# Patient Record
Sex: Male | Born: 1970 | Race: White | Hispanic: No | Marital: Married | Smoking: Former smoker
Health system: Southern US, Community
[De-identification: ages and names within clinical notes are randomized; demographics above are authoritative.]

## PROBLEM LIST (undated history)

## (undated) DIAGNOSIS — S92109A Unspecified fracture of unspecified talus, initial encounter for closed fracture: Secondary | ICD-10-CM

## (undated) DIAGNOSIS — Z720 Tobacco use: Secondary | ICD-10-CM

## (undated) DIAGNOSIS — T7840XA Allergy, unspecified, initial encounter: Secondary | ICD-10-CM

## (undated) DIAGNOSIS — E559 Vitamin D deficiency, unspecified: Secondary | ICD-10-CM

## (undated) HISTORY — PX: INGUINAL HERNIA REPAIR: SUR1180

## (undated) HISTORY — PX: WISDOM TOOTH EXTRACTION: SHX21

---
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
## 2007-10-16 ENCOUNTER — Emergency Department (HOSPITAL_COMMUNITY): Admission: EM | Admit: 2007-10-16 | Discharge: 2007-10-16 | Payer: Self-pay | Admitting: Emergency Medicine

## 2010-12-02 ENCOUNTER — Emergency Department (HOSPITAL_BASED_OUTPATIENT_CLINIC_OR_DEPARTMENT_OTHER)
Admission: EM | Admit: 2010-12-02 | Discharge: 2010-12-02 | Disposition: A | Discharge status: Home / Self Care | Payer: BC Managed Care – PPO | Attending: Emergency Medicine | Admitting: Emergency Medicine

## 2010-12-02 ENCOUNTER — Emergency Department (INDEPENDENT_AMBULATORY_CARE_PROVIDER_SITE_OTHER): Payer: BC Managed Care – PPO

## 2010-12-02 ENCOUNTER — Encounter: Payer: Self-pay | Admitting: *Deleted

## 2010-12-02 DIAGNOSIS — M25539 Pain in unspecified wrist: Secondary | ICD-10-CM

## 2010-12-02 DIAGNOSIS — W19XXXA Unspecified fall, initial encounter: Secondary | ICD-10-CM

## 2010-12-02 DIAGNOSIS — IMO0002 Reserved for concepts with insufficient information to code with codable children: Secondary | ICD-10-CM

## 2010-12-02 DIAGNOSIS — T148XXA Other injury of unspecified body region, initial encounter: Secondary | ICD-10-CM | POA: Insufficient documentation

## 2010-12-02 DIAGNOSIS — M79609 Pain in unspecified limb: Secondary | ICD-10-CM

## 2010-12-02 DIAGNOSIS — S63509A Unspecified sprain of unspecified wrist, initial encounter: Secondary | ICD-10-CM

## 2010-12-02 MED ORDER — HYDROCODONE-ACETAMINOPHEN 5-500 MG PO TABS: 1.0000 | ORAL_TABLET | Freq: Four times a day (QID) | ORAL | Status: AC | PRN

## 2010-12-02 NOTE — ED Provider Notes (Signed)
History     CSN: [id number] Arrival date & time: 12/02/2010  2:34 PM   First MD Initiated Contact with Patient 12/02/10 1437      Chief Complaint  Patient presents with  . Wrist Pain    (Consider location/radiation/quality/duration/timing/severity/associated sxs/prior treatment) Patient is a 40 y.o. male presenting with wrist pain. The history is provided by the patient.  Wrist Pain  fall onto outstretched right wrist yesterday. Constant, dull, non radiating pain to area. Worse w palpation and movement. Skin intact. No numbness/weakness. No elbow or shoulder pain. Right hand dominant. Denies other pain or injury.   No past medical history on file.  No past surgical history on file.  No family history on file.  History  Substance Use Topics  . Smoking status: Not on file  . Smokeless tobacco: Not on file  . Alcohol Use: Not on file      Review of Systems  Constitutional: Negative for fever.  HENT: Negative for neck pain.   Musculoskeletal: Negative for back pain.  Skin: Negative for wound.  Neurological: Negative for weakness and numbness.    Allergies  Review of patient's allergies indicates not on file.  Home Medications  No current outpatient prescriptions on file.  There were no vitals taken for this visit.  Physical Exam  Nursing note and vitals reviewed. Constitutional: He is oriented to person, place, and time. He appears well-developed and well-nourished. No distress.  HENT:  Head: Atraumatic.  Eyes: Pupils are equal, round, and reactive to light.  Neck: Neck supple. No tracheal deviation present.  Cardiovascular: Normal rate.   Pulmonary/Chest: Effort normal. No accessory muscle usage. No respiratory distress.  Musculoskeletal:       c spine non tender. sts and tenderness to right wrist, distal radius. No focal scaphoid tenderness. Radial pulse 2+. Hand nvi. Good rom and elbow without pain. No other focal bony tenderness  Neurological: He is alert  and oriented to person, place, and time.       Motor intact. Steady gait  Skin: Skin is warm and dry.  Psychiatric: He has a normal mood and affect.    ED Course  Procedures (including critical care time)  No results found for this or any previous visit. Dg Wrist Complete Right  12/02/2010  *RADIOLOGY REPORT*  Clinical Data: Injury last night  RIGHT WRIST - COMPLETE 3+ VIEW  Comparison: None.  Findings: Chronic deformity of the fourth metacarpal. On the lateral view, at the carpometacarpal junction, there is a small bony density with a sclerotic border which has a chronic appearance.  No acute fracture or dislocation.  Unremarkable soft tissues.  IMPRESSION: Chronic changes.  No acute bony pathology.  Original Report Authenticated By: Donavan Burnet, M.D.   Dg Hand Complete Right  12/02/2010  *RADIOLOGY REPORT*  Clinical Data: Pain post fall  RIGHT HAND - COMPLETE 3+ VIEW  Comparison: None.  Findings: Three views of the right hand submitted.  No acute fracture or subluxation.  There is old fracture deformity of the fourth metacarpal.  IMPRESSION: No acute fracture or subluxation.  Old fracture deformity of the fourth metacarpal .  Original Report Authenticated By: Natasha Mead, M.D.      No diagnosis found.    MDM  Xrays.    Pt tender over distal radius. Radiologist interpret films noted/neg.  I discussed w pt concern for possible non displaced distal radius fx/occult fx. Will splint, discussed ortho f/u in coming week.     Suzi Roots,  MD 12/02/10 1532

## 2010-12-02 NOTE — ED Notes (Signed)
Playing with kids in yard yesterday afternoon fell and landed on right wrist states he heard and felt a "pop" now pain and swelling from mid right hand to lower forearm area.

## 2018-04-27 ENCOUNTER — Encounter (HOSPITAL_BASED_OUTPATIENT_CLINIC_OR_DEPARTMENT_OTHER): Payer: Self-pay | Admitting: Emergency Medicine

## 2018-04-27 ENCOUNTER — Inpatient Hospital Stay (HOSPITAL_BASED_OUTPATIENT_CLINIC_OR_DEPARTMENT_OTHER)
Admission: EM | Admit: 2018-04-27 | Discharge: 2018-04-29 | DRG: 493 | Disposition: A | Discharge status: Home / Self Care | Payer: BLUE CROSS/BLUE SHIELD | Source: Ambulatory Visit | Attending: Orthopedic Surgery | Admitting: Orthopedic Surgery

## 2018-04-27 ENCOUNTER — Emergency Department (HOSPITAL_BASED_OUTPATIENT_CLINIC_OR_DEPARTMENT_OTHER): Payer: BLUE CROSS/BLUE SHIELD

## 2018-04-27 ENCOUNTER — Inpatient Hospital Stay (HOSPITAL_BASED_OUTPATIENT_CLINIC_OR_DEPARTMENT_OTHER): Payer: BLUE CROSS/BLUE SHIELD

## 2018-04-27 ENCOUNTER — Other Ambulatory Visit: Payer: Self-pay

## 2018-04-27 DIAGNOSIS — S92141A Displaced dome fracture of right talus, initial encounter for closed fracture: Secondary | ICD-10-CM | POA: Diagnosis present

## 2018-04-27 DIAGNOSIS — F17200 Nicotine dependence, unspecified, uncomplicated: Secondary | ICD-10-CM | POA: Diagnosis present

## 2018-04-27 DIAGNOSIS — S92144A Nondisplaced dome fracture of right talus, initial encounter for closed fracture: Secondary | ICD-10-CM

## 2018-04-27 DIAGNOSIS — S93431A Sprain of tibiofibular ligament of right ankle, initial encounter: Secondary | ICD-10-CM | POA: Diagnosis present

## 2018-04-27 DIAGNOSIS — S82871A Displaced pilon fracture of right tibia, initial encounter for closed fracture: Secondary | ICD-10-CM | POA: Diagnosis present

## 2018-04-27 DIAGNOSIS — Y9389 Activity, other specified: Secondary | ICD-10-CM

## 2018-04-27 DIAGNOSIS — Y92018 Other place in single-family (private) house as the place of occurrence of the external cause: Secondary | ICD-10-CM

## 2018-04-27 DIAGNOSIS — S82209A Unspecified fracture of shaft of unspecified tibia, initial encounter for closed fracture: Secondary | ICD-10-CM | POA: Diagnosis present

## 2018-04-27 DIAGNOSIS — W11XXXA Fall on and from ladder, initial encounter: Secondary | ICD-10-CM | POA: Diagnosis present

## 2018-04-27 DIAGNOSIS — S82251A Displaced comminuted fracture of shaft of right tibia, initial encounter for closed fracture: Secondary | ICD-10-CM | POA: Diagnosis present

## 2018-04-27 DIAGNOSIS — Z419 Encounter for procedure for purposes other than remedying health state, unspecified: Secondary | ICD-10-CM

## 2018-04-27 DIAGNOSIS — Z09 Encounter for follow-up examination after completed treatment for conditions other than malignant neoplasm: Secondary | ICD-10-CM

## 2018-04-27 DIAGNOSIS — E8889 Other specified metabolic disorders: Secondary | ICD-10-CM | POA: Diagnosis present

## 2018-04-27 DIAGNOSIS — D62 Acute posthemorrhagic anemia: Secondary | ICD-10-CM | POA: Diagnosis not present

## 2018-04-27 DIAGNOSIS — Z720 Tobacco use: Secondary | ICD-10-CM | POA: Diagnosis present

## 2018-04-27 DIAGNOSIS — Z01818 Encounter for other preprocedural examination: Secondary | ICD-10-CM

## 2018-04-27 DIAGNOSIS — S82831A Other fracture of upper and lower end of right fibula, initial encounter for closed fracture: Secondary | ICD-10-CM | POA: Diagnosis present

## 2018-04-27 DIAGNOSIS — S92109A Unspecified fracture of unspecified talus, initial encounter for closed fracture: Secondary | ICD-10-CM | POA: Diagnosis present

## 2018-04-27 HISTORY — DX: Tobacco use: Z72.0

## 2018-04-27 HISTORY — DX: Unspecified fracture of unspecified talus, initial encounter for closed fracture: S92.109A

## 2018-04-27 LAB — CBC WITH DIFFERENTIAL/PLATELET
Abs Immature Granulocytes: 0.03 10*3/uL (ref 0.00–0.07)
Basophils Absolute: 0.1 10*3/uL (ref 0.0–0.1)
Basophils Relative: 1 %
Eosinophils Absolute: 0.1 10*3/uL (ref 0.0–0.5)
Eosinophils Relative: 2 %
HCT: 41.2 % (ref 39.0–52.0)
Hemoglobin: 13.6 g/dL (ref 13.0–17.0)
Immature Granulocytes: 0 %
Lymphocytes Relative: 34 %
Lymphs Abs: 2.6 10*3/uL (ref 0.7–4.0)
MCH: 32.4 pg (ref 26.0–34.0)
MCHC: 33 g/dL (ref 30.0–36.0)
MCV: 98.1 fL (ref 80.0–100.0)
Monocytes Absolute: 0.7 10*3/uL (ref 0.1–1.0)
Monocytes Relative: 9 %
Neutro Abs: 4.2 10*3/uL (ref 1.7–7.7)
Neutrophils Relative %: 54 %
Platelets: 263 10*3/uL (ref 150–400)
RBC: 4.2 MIL/uL — ABNORMAL LOW (ref 4.22–5.81)
RDW: 13.8 % (ref 11.5–15.5)
WBC: 7.7 10*3/uL (ref 4.0–10.5)
nRBC: 0 % (ref 0.0–0.2)

## 2018-04-27 LAB — BASIC METABOLIC PANEL
Anion gap: 9 (ref 5–15)
BUN: 10 mg/dL (ref 6–20)
CO2: 24 mmol/L (ref 22–32)
Calcium: 8.5 mg/dL — ABNORMAL LOW (ref 8.9–10.3)
Chloride: 100 mmol/L (ref 98–111)
Creatinine, Ser: 0.64 mg/dL (ref 0.61–1.24)
GFR calc Af Amer: >60 mL/min (ref >60)
GFR calc non Af Amer: >60 mL/min (ref >60)
Glucose, Bld: 93 mg/dL (ref 70–99)
Potassium: 4 mmol/L (ref 3.5–5.1)
Sodium: 133 mmol/L — ABNORMAL LOW (ref 135–145)

## 2018-04-27 LAB — PROTIME-INR
INR: 0.9 (ref 0.8–1.2)
Prothrombin Time: 12.1 seconds (ref 11.4–15.2)

## 2018-04-27 MED ORDER — ACETAMINOPHEN 325 MG PO TABS: 325.0000 mg | ORAL_TABLET | Freq: Four times a day (QID) | ORAL | Status: DC | PRN

## 2018-04-27 MED ORDER — HYDROMORPHONE HCL 1 MG/ML IJ SOLN: 0.5000 mg | INTRAMUSCULAR | Status: DC | PRN

## 2018-04-27 MED ORDER — VITAMIN C 500 MG PO TABS
1000.0000 mg | ORAL_TABLET | Freq: Every day | ORAL | Status: DC
  Filled 2018-04-27: qty 2

## 2018-04-27 MED ORDER — METHOCARBAMOL 1000 MG/10ML IJ SOLN
500.0000 mg | Freq: Four times a day (QID) | INTRAVENOUS | Status: DC | PRN
  Filled 2018-04-27: qty 5

## 2018-04-27 MED ORDER — ENOXAPARIN SODIUM 40 MG/0.4ML ~~LOC~~ SOLN: 40.0000 mg | SUBCUTANEOUS | Status: DC

## 2018-04-27 MED ORDER — MORPHINE SULFATE (PF) 2 MG/ML IV SOLN: 0.5000 mg | INTRAVENOUS | Status: DC | PRN

## 2018-04-27 MED ORDER — HYDROCODONE-ACETAMINOPHEN 5-325 MG PO TABS: 1.0000 | ORAL_TABLET | ORAL | Status: DC | PRN

## 2018-04-27 MED ORDER — HYDROMORPHONE HCL 1 MG/ML IJ SOLN
1.0000 mg | Freq: Once | INTRAMUSCULAR | Status: AC
  Administered 2018-04-27: 1 mg via INTRAVENOUS
  Filled 2018-04-27: qty 1

## 2018-04-27 MED ORDER — METHOCARBAMOL 500 MG PO TABS: 500.0000 mg | ORAL_TABLET | Freq: Four times a day (QID) | ORAL | Status: DC | PRN

## 2018-04-27 MED ORDER — MORPHINE SULFATE (PF) 4 MG/ML IV SOLN
4.0000 mg | Freq: Once | INTRAVENOUS | Status: AC
  Administered 2018-04-27: 4 mg via INTRAVENOUS
  Filled 2018-04-27: qty 1

## 2018-04-27 MED ORDER — DOCUSATE SODIUM 100 MG PO CAPS
100.0000 mg | ORAL_CAPSULE | Freq: Two times a day (BID) | ORAL | Status: DC
  Administered 2018-04-28: 100 mg via ORAL
  Filled 2018-04-27: qty 1

## 2018-04-27 MED ORDER — CEFAZOLIN SODIUM-DEXTROSE 2-4 GM/100ML-% IV SOLN
2.0000 g | INTRAVENOUS | Status: AC
  Administered 2018-04-28: 2 g via INTRAVENOUS
  Filled 2018-04-27 (×3): qty 100

## 2018-04-27 MED ORDER — ONDANSETRON HCL 4 MG/2ML IJ SOLN: 4.0000 mg | Freq: Three times a day (TID) | INTRAMUSCULAR | Status: DC | PRN

## 2018-04-27 MED ORDER — HYDROCODONE-ACETAMINOPHEN 7.5-325 MG PO TABS
1.0000 | ORAL_TABLET | ORAL | Status: DC | PRN
  Administered 2018-04-28 (×2): 1 via ORAL
  Filled 2018-04-27: qty 1
  Filled 2018-04-27: qty 2
  Filled 2018-04-27: qty 1

## 2018-04-27 MED ORDER — LACTATED RINGERS IV SOLN
INTRAVENOUS | Status: DC
  Administered 2018-04-28: 100 mL/h via INTRAVENOUS

## 2018-04-27 MED ORDER — POLYETHYLENE GLYCOL 3350 17 G PO PACK
17.0000 g | PACK | Freq: Every day | ORAL | Status: DC
  Filled 2018-04-27: qty 1

## 2018-04-27 NOTE — ED Provider Notes (Signed)
[facility] EMERGENCY DEPARTMENT Provider Note   CSN: [id number] Arrival date & time: 04/27/18  2051    History   Chief Complaint Chief Complaint  Patient presents with   Fall    HPI Hoa Deriso is a 48 y.o. male otherwise a healthy here presenting with fall with right leg pain.  Patient states that he was on top of a 6 foot ladder trying to cut a bush and accidentally fell off the ladder and landed directly on his right leg.  Denies any head injury or other injuries.  In particular, patient denies any left leg pain or back pain.  Patient states that his last meal was around noon today.  States that he is not on any blood thinners. Otherwise healthy, not taking any meds.      The history is provided by the patient.    History reviewed. No pertinent past medical history.  Patient Active Problem List   Diagnosis Date Noted   Tibial fracture 04/27/2018    History reviewed. No pertinent surgical history.      Home Medications    Prior to Admission medications   Not on File    Family History No family history on file.  Social History Social History   Tobacco Use   Smoking status: Current Some Day Smoker   Smokeless tobacco: Never Used  Substance Use Topics   Alcohol use: Yes    Comment: occ   Drug use: No     Allergies   Patient has no known allergies.   Review of Systems Review of Systems  Musculoskeletal:       R leg pain   All other systems reviewed and are negative.    Physical Exam Updated Vital Signs BP (!) 145/102 (BP Location: Right Arm)    Pulse 83    Resp (!) 21    Ht  (1.727 m)    Wt 66.2 kg    SpO2 100%    BMI 22.20 kg/m   Physical Exam Vitals signs and nursing note reviewed.  HENT:     Head: Normocephalic.     Nose: Nose normal.     Mouth/Throat:     Mouth: Mucous membranes are moist.  Eyes:     Extraocular Movements: Extraocular movements intact.     Pupils: Pupils are equal, round, and reactive to  light.  Neck:     Musculoskeletal: Normal range of motion.  Cardiovascular:     Rate and Rhythm: Normal rate.  Pulmonary:     Effort: Pulmonary effort is normal.     Breath sounds: Normal breath sounds.  Abdominal:     General: Abdomen is flat.     Palpations: Abdomen is soft.  Musculoskeletal:     Comments: No lower spinal tenderness. Pelvis stable. Nl ROM bilaterally. No femur tenderness, nl ROM R knee. Obvious swelling distal tib/fib with no obvious skin breakdown. Mild mid foot tenderness. Able to wiggle toes, nl capillary refill and nl DP pulse.   Skin:    General: Skin is warm.     Capillary Refill: Capillary refill takes less than 2 seconds.  Neurological:     General: No focal deficit present.     Mental Status: He is alert and oriented to person, place, and time.  Psychiatric:        Mood and Affect: Mood normal.        Behavior: Behavior normal.      ED Treatments / Results  Labs (all  labs ordered are listed, but only abnormal results are displayed) Labs Reviewed  CBC WITH DIFFERENTIAL/PLATELET - Abnormal; Notable for the following components:      Result Value   RBC 4.20 (*)    All other components within normal limits  BASIC METABOLIC PANEL - Abnormal; Notable for the following components:   Sodium 133 (*)    Calcium 8.5 (*)    All other components within normal limits  PROTIME-INR    EKG None  Radiology Dg Tibia/fibula Right  Result Date: 04/27/2018 CLINICAL DATA:  48 year old male status post fall from ladder. EXAM: RIGHT TIBIA AND FIBULA - 2 VIEW COMPARISON:  Right foot series today. FINDINGS: Alignment about the right knee is preserved and the proximal right tibia and fibula appear intact. Comminuted spiral fracture of the distal right tibia shaft extending through the metadiaphysis to the plafond. Mild over riding, medial displacement, posterior displacement, lateral and posterior angulation. Superimposed mildly comminuted oblique fracture of the medial  malleolus which is mildly distracted. Comminuted and impacted fracture of the distal right fibula metadiaphysis with mild medial displacement and posterior angulation. Comminution appears to extend into the lateral malleolus. The talar dome appears grossly intact, but the talus is fractured better demonstrated on the foot series today. No dislocation of the mortise joint. IMPRESSION: 1. Comminuted spiral fracture of the distal right tibia shaft extending to the plafond. Mild displacement and angulation. Associated oblique fracture of the medial malleolus. 2. Comminuted and impacted fracture of the distal right fibula metadiaphysis. 3. No dislocation of the mortise joint, but talus fracture better demonstrated on the foot series. Electronically Signed   By: Odessa Fleming M.D.   On: 04/27/2018 21:30   Dg Foot Complete Right  Result Date: 04/27/2018 CLINICAL DATA:  48 year old male status post fall from ladder. EXAM: RIGHT FOOT COMPLETE - 3+ VIEW COMPARISON:  Right tib-fib series today reported separately. FINDINGS: Comminuted fractures of the distal tibia and fibula with involvement of the mortise joint are reported separately. On the lateral view there is a fracture through the anterior body of the talus which appears mildly comminuted and minimally displaced. The calcaneus appears to remain intact. The remaining tarsal bones appear intact and normally aligned. No metatarsal or phalanx fracture identified. IMPRESSION: 1. Comminuted and minimally displaced fracture through the anterior body of the talus seen on the lateral view. 2. No other acute fracture or dislocation identified in the right foot. 3. Comminuted, intra-articular distal tibia and fibula fractures reported separately. Electronically Signed   By: Odessa Fleming M.D.   On: 04/27/2018 21:33    Procedures Procedures (including critical care time)  Medications Ordered in ED Medications  HYDROmorphone (DILAUDID) injection 0.5 mg (0 mg Intravenous Hold 04/27/18  2201)  ondansetron (ZOFRAN) injection 4 mg (0 mg Intravenous Hold 04/27/18 2202)  morphine 4 MG/ML injection 4 mg (4 mg Intravenous Given 04/27/18 2142)     Initial Impression / Assessment and Plan / ED Course  I have reviewed the triage vital signs and the nursing notes.  Pertinent labs & imaging results that were available during my care of the patient were reviewed by me and considered in my medical decision making (see chart for details).       Takoda Zhao is a 48 y.o. male here with R leg injury after falling off 6 ft ladder. No head injury or LOC. No signs of chest/abdominal trauma. No spinal tenderness. He has obvious R tib/fib and foot tenderness. Will get xrays to r/o fracture.  10:04 PM Xray showed R tibial shaft and fibula and talus fracture. I talked to Dr. Carola FrostHandy from ortho. He requested CT ankle to further assess the talus. He also wants a splint placed and he will be accepting the patient onto his service. He request keeping patient NPO and he will do surgery tomorrow. [facility]    Final Clinical Impressions(s) / ED Diagnoses   Final diagnoses:  Closed displaced comminuted fracture of shaft of right tibia, initial encounter  Closed nondisplaced fracture of dome of right talus, initial encounter    ED Discharge Orders    None       Charlynne PanderYao, Bodee Lafoe Hsienta, MD 04/27/18 2204

## 2018-04-27 NOTE — ED Notes (Signed)
Called [facility] - advised that patient has a bed ready @ [facility]

## 2018-04-27 NOTE — ED Notes (Signed)
PMS intact before and after. Pt tolerated well. All questions answered. 

## 2018-04-27 NOTE — ED Notes (Signed)
Pt pain 0/10 after morphine.

## 2018-04-27 NOTE — ED Notes (Signed)
Pt last ate approx 12pm. Reports drinking water on the way to ER. Pt informed to remain NPO until further notice.

## 2018-04-27 NOTE — ED Notes (Signed)
ED Provider at bedside. 

## 2018-04-27 NOTE — ED Notes (Signed)
Pt to XR. Declines any pain medication at this time.

## 2018-04-27 NOTE — ED Triage Notes (Signed)
Pt reports falling from ladder ~2030 from about a 65ft height. States his only injury is R leg (+deformity). Strong pedal pulse to R foot. Skin intact.

## 2018-04-28 ENCOUNTER — Inpatient Hospital Stay (HOSPITAL_COMMUNITY): Payer: BLUE CROSS/BLUE SHIELD

## 2018-04-28 ENCOUNTER — Encounter (HOSPITAL_COMMUNITY): Payer: Self-pay | Admitting: Registered Nurse

## 2018-04-28 ENCOUNTER — Inpatient Hospital Stay (HOSPITAL_COMMUNITY): Payer: BLUE CROSS/BLUE SHIELD | Admitting: Anesthesiology

## 2018-04-28 ENCOUNTER — Encounter (HOSPITAL_COMMUNITY)
Admission: EM | Disposition: A | Discharge status: Home / Self Care | Payer: Self-pay | Source: Home / Self Care | Attending: Orthopedic Surgery

## 2018-04-28 HISTORY — PX: EXTERNAL FIXATION LEG: SHX1549

## 2018-04-28 LAB — CBC
HCT: 36.3 % — ABNORMAL LOW (ref 39.0–52.0)
Hemoglobin: 12.6 g/dL — ABNORMAL LOW (ref 13.0–17.0)
MCH: 33.5 pg (ref 26.0–34.0)
MCHC: 34.7 g/dL (ref 30.0–36.0)
MCV: 96.5 fL (ref 80.0–100.0)
Platelets: 269 10*3/uL (ref 150–400)
RBC: 3.76 MIL/uL — ABNORMAL LOW (ref 4.22–5.81)
RDW: 13.8 % (ref 11.5–15.5)
WBC: 8.9 10*3/uL (ref 4.0–10.5)
nRBC: 0 % (ref 0.0–0.2)

## 2018-04-28 LAB — CREATININE, SERUM
Creatinine, Ser: 0.74 mg/dL (ref 0.61–1.24)
GFR calc Af Amer: >60 mL/min (ref >60)
GFR calc non Af Amer: >60 mL/min (ref >60)

## 2018-04-28 LAB — MRSA PCR SCREENING: MRSA by PCR: NEGATIVE

## 2018-04-28 SURGERY — EXTERNAL FIXATION, LOWER EXTREMITY
Anesthesia: General | Site: Leg Lower | Laterality: Right

## 2018-04-28 MED ORDER — PROPOFOL 10 MG/ML IV BOLUS
INTRAVENOUS | Status: AC
  Filled 2018-04-28: qty 20

## 2018-04-28 MED ORDER — ACETAMINOPHEN 325 MG PO TABS: 325.0000 mg | ORAL_TABLET | Freq: Four times a day (QID) | ORAL | Status: DC | PRN

## 2018-04-28 MED ORDER — METHOCARBAMOL 1000 MG/10ML IJ SOLN
500.0000 mg | Freq: Three times a day (TID) | INTRAVENOUS | Status: DC
  Filled 2018-04-28: qty 5

## 2018-04-28 MED ORDER — FENTANYL CITRATE (PF) 100 MCG/2ML IJ SOLN
INTRAMUSCULAR | Status: DC | PRN
  Administered 2018-04-28 (×2): 50 ug via INTRAVENOUS

## 2018-04-28 MED ORDER — DEXAMETHASONE SODIUM PHOSPHATE 10 MG/ML IJ SOLN
INTRAMUSCULAR | Status: AC
  Filled 2018-04-28: qty 1

## 2018-04-28 MED ORDER — SUCCINYLCHOLINE CHLORIDE 200 MG/10ML IV SOSY
PREFILLED_SYRINGE | INTRAVENOUS | Status: AC
  Filled 2018-04-28: qty 10

## 2018-04-28 MED ORDER — POTASSIUM CHLORIDE IN NACL 20-0.9 MEQ/L-% IV SOLN
INTRAVENOUS | Status: DC
  Administered 2018-04-28: 13:00:00 via INTRAVENOUS
  Filled 2018-04-28: qty 1000

## 2018-04-28 MED ORDER — ROPIVACAINE HCL 5 MG/ML IJ SOLN
INTRAMUSCULAR | Status: DC | PRN
  Administered 2018-04-28: 25 mL via PERINEURAL

## 2018-04-28 MED ORDER — POLYETHYLENE GLYCOL 3350 17 G PO PACK
17.0000 g | PACK | Freq: Every day | ORAL | Status: DC
  Administered 2018-04-29: 17 g via ORAL
  Filled 2018-04-28 (×2): qty 1

## 2018-04-28 MED ORDER — HYDROCODONE-ACETAMINOPHEN 5-325 MG PO TABS
1.0000 | ORAL_TABLET | ORAL | Status: DC | PRN
  Administered 2018-04-28: 1 via ORAL
  Administered 2018-04-28 – 2018-04-29 (×2): 2 via ORAL
  Filled 2018-04-28 (×2): qty 2
  Filled 2018-04-28: qty 1

## 2018-04-28 MED ORDER — ONDANSETRON HCL 4 MG PO TABS: 4.0000 mg | ORAL_TABLET | Freq: Four times a day (QID) | ORAL | Status: DC | PRN

## 2018-04-28 MED ORDER — MORPHINE SULFATE (PF) 2 MG/ML IV SOLN: 0.5000 mg | INTRAVENOUS | Status: DC | PRN

## 2018-04-28 MED ORDER — FENTANYL CITRATE (PF) 250 MCG/5ML IJ SOLN
INTRAMUSCULAR | Status: AC
  Filled 2018-04-28: qty 5

## 2018-04-28 MED ORDER — SUGAMMADEX SODIUM 200 MG/2ML IV SOLN
INTRAVENOUS | Status: DC | PRN
  Administered 2018-04-28: 132.4 mg via INTRAVENOUS

## 2018-04-28 MED ORDER — LIDOCAINE 2% (20 MG/ML) 5 ML SYRINGE
INTRAMUSCULAR | Status: DC | PRN
  Administered 2018-04-28: 100 mg via INTRAVENOUS

## 2018-04-28 MED ORDER — LIDOCAINE 2% (20 MG/ML) 5 ML SYRINGE
INTRAMUSCULAR | Status: AC
  Filled 2018-04-28: qty 5

## 2018-04-28 MED ORDER — ENOXAPARIN SODIUM 40 MG/0.4ML ~~LOC~~ SOLN
40.0000 mg | SUBCUTANEOUS | Status: DC
  Administered 2018-04-29: 40 mg via SUBCUTANEOUS
  Filled 2018-04-28: qty 0.4

## 2018-04-28 MED ORDER — ACETAMINOPHEN 500 MG PO TABS
500.0000 mg | ORAL_TABLET | Freq: Two times a day (BID) | ORAL | Status: DC
  Administered 2018-04-28 – 2018-04-29 (×2): 500 mg via ORAL
  Filled 2018-04-28 (×2): qty 1

## 2018-04-28 MED ORDER — METOCLOPRAMIDE HCL 5 MG PO TABS: 5.0000 mg | ORAL_TABLET | Freq: Three times a day (TID) | ORAL | Status: DC | PRN

## 2018-04-28 MED ORDER — ONDANSETRON HCL 4 MG/2ML IJ SOLN
INTRAMUSCULAR | Status: AC
  Filled 2018-04-28: qty 2

## 2018-04-28 MED ORDER — MIDAZOLAM HCL 2 MG/2ML IJ SOLN
INTRAMUSCULAR | Status: AC
  Filled 2018-04-28: qty 2

## 2018-04-28 MED ORDER — ONDANSETRON HCL 4 MG/2ML IJ SOLN: 4.0000 mg | Freq: Four times a day (QID) | INTRAMUSCULAR | Status: DC | PRN

## 2018-04-28 MED ORDER — SUCCINYLCHOLINE CHLORIDE 200 MG/10ML IV SOSY
PREFILLED_SYRINGE | INTRAVENOUS | Status: DC | PRN
  Administered 2018-04-28: 100 mg via INTRAVENOUS

## 2018-04-28 MED ORDER — CEFAZOLIN SODIUM-DEXTROSE 1-4 GM/50ML-% IV SOLN
1.0000 g | Freq: Four times a day (QID) | INTRAVENOUS | Status: AC
  Administered 2018-04-28 – 2018-04-29 (×3): 1 g via INTRAVENOUS
  Filled 2018-04-28 (×3): qty 50

## 2018-04-28 MED ORDER — PROPOFOL 10 MG/ML IV BOLUS
INTRAVENOUS | Status: DC | PRN
  Administered 2018-04-28: 200 mg via INTRAVENOUS
  Administered 2018-04-28: 30 mg via INTRAVENOUS

## 2018-04-28 MED ORDER — ONDANSETRON HCL 4 MG/2ML IJ SOLN
INTRAMUSCULAR | Status: DC | PRN
  Administered 2018-04-28: 4 mg via INTRAVENOUS

## 2018-04-28 MED ORDER — METOCLOPRAMIDE HCL 5 MG/ML IJ SOLN: 5.0000 mg | Freq: Three times a day (TID) | INTRAMUSCULAR | Status: DC | PRN

## 2018-04-28 MED ORDER — ROCURONIUM BROMIDE 10 MG/ML (PF) SYRINGE
PREFILLED_SYRINGE | INTRAVENOUS | Status: DC | PRN
  Administered 2018-04-28: 30 mg via INTRAVENOUS
  Administered 2018-04-28: 20 mg via INTRAVENOUS

## 2018-04-28 MED ORDER — DEXAMETHASONE SODIUM PHOSPHATE 10 MG/ML IJ SOLN
INTRAMUSCULAR | Status: DC | PRN
  Administered 2018-04-28: 10 mg via INTRAVENOUS

## 2018-04-28 MED ORDER — ROCURONIUM BROMIDE 50 MG/5ML IV SOSY
PREFILLED_SYRINGE | INTRAVENOUS | Status: AC
  Filled 2018-04-28: qty 5

## 2018-04-28 MED ORDER — LACTATED RINGERS IV SOLN
INTRAVENOUS | Status: DC | PRN
  Administered 2018-04-28 (×2): via INTRAVENOUS

## 2018-04-28 MED ORDER — DOCUSATE SODIUM 100 MG PO CAPS
100.0000 mg | ORAL_CAPSULE | Freq: Two times a day (BID) | ORAL | Status: DC
  Administered 2018-04-28 – 2018-04-29 (×2): 100 mg via ORAL
  Filled 2018-04-28 (×2): qty 1

## 2018-04-28 MED ORDER — MIDAZOLAM HCL 5 MG/5ML IJ SOLN
INTRAMUSCULAR | Status: DC | PRN
  Administered 2018-04-28: 2 mg via INTRAVENOUS

## 2018-04-28 MED ORDER — HYDROCODONE-ACETAMINOPHEN 7.5-325 MG PO TABS: 1.0000 | ORAL_TABLET | ORAL | Status: DC | PRN

## 2018-04-28 MED ORDER — METHOCARBAMOL 750 MG PO TABS
750.0000 mg | ORAL_TABLET | Freq: Three times a day (TID) | ORAL | Status: DC
  Administered 2018-04-28 – 2018-04-29 (×4): 750 mg via ORAL
  Filled 2018-04-28 (×4): qty 1

## 2018-04-28 MED ORDER — 0.9 % SODIUM CHLORIDE (POUR BTL) OPTIME
TOPICAL | Status: DC | PRN
  Administered 2018-04-28: 09:00:00 1000 mL

## 2018-04-28 MED ORDER — GABAPENTIN 300 MG PO CAPS
300.0000 mg | ORAL_CAPSULE | Freq: Two times a day (BID) | ORAL | Status: DC
  Administered 2018-04-28 – 2018-04-29 (×3): 300 mg via ORAL
  Filled 2018-04-28 (×3): qty 1

## 2018-04-28 MED ORDER — LIDOCAINE-EPINEPHRINE (PF) 1.5 %-1:200000 IJ SOLN
INTRAMUSCULAR | Status: DC | PRN
  Administered 2018-04-28: 15 mL via PERINEURAL

## 2018-04-28 SURGICAL SUPPLY — 65 items
BANDAGE ACE 3X5.8 VEL STRL LF (GAUZE/BANDAGES/DRESSINGS) ×4 IMPLANT
BANDAGE ACE 4X5 VEL STRL LF (GAUZE/BANDAGES/DRESSINGS) ×4 IMPLANT
BANDAGE ACE 6X5 VEL STRL LF (GAUZE/BANDAGES/DRESSINGS) IMPLANT
BANDAGE ESMARK 6X9 LF (GAUZE/BANDAGES/DRESSINGS) ×2 IMPLANT
BAR GLASS FIBER EXFX 11X150 (EXFIX) ×8 IMPLANT
BAR GLASS FIBER EXFX 11X400 (EXFIX) ×8 IMPLANT
BNDG ELASTIC 2X5.8 VLCR STR LF (GAUZE/BANDAGES/DRESSINGS) ×4 IMPLANT
BNDG ESMARK 6X9 LF (GAUZE/BANDAGES/DRESSINGS) ×4
BNDG GAUZE ELAST 4 BULKY (GAUZE/BANDAGES/DRESSINGS) ×8 IMPLANT
BRUSH SCRUB SURG 4.25 DISP (MISCELLANEOUS) ×8 IMPLANT
CLAMP BLUE BAR TO PIN (EXFIX) ×24 IMPLANT
COVER MAYO STAND STRL (DRAPES) ×4 IMPLANT
COVER SURGICAL LIGHT HANDLE (MISCELLANEOUS) ×4 IMPLANT
COVER WAND RF STERILE (DRAPES) ×4 IMPLANT
DRAPE C-ARM 42X72 X-RAY (DRAPES) ×4 IMPLANT
DRAPE C-ARMOR (DRAPES) ×4 IMPLANT
DRAPE HALF SHEET 40X57 (DRAPES) ×3 IMPLANT
DRAPE U-SHAPE 47X51 STRL (DRAPES) ×4 IMPLANT
DRSG EMULSION OIL 3X3 NADH (GAUZE/BANDAGES/DRESSINGS) IMPLANT
DRSG MEPILEX BORDER 4X8 (GAUZE/BANDAGES/DRESSINGS) ×4 IMPLANT
ELECT REM PT RETURN 9FT ADLT (ELECTROSURGICAL) ×4
ELECTRODE REM PT RTRN 9FT ADLT (ELECTROSURGICAL) ×2 IMPLANT
GAUZE SPONGE 4X4 12PLY STRL (GAUZE/BANDAGES/DRESSINGS) ×12 IMPLANT
GLOVE BIO SURGEON STRL SZ7.5 (GLOVE) ×4 IMPLANT
GLOVE BIO SURGEON STRL SZ8 (GLOVE) ×4 IMPLANT
GLOVE BIOGEL PI IND STRL 7.5 (GLOVE) ×2 IMPLANT
GLOVE BIOGEL PI IND STRL 8 (GLOVE) ×2 IMPLANT
GLOVE BIOGEL PI INDICATOR 7.5 (GLOVE) ×2
GLOVE BIOGEL PI INDICATOR 8 (GLOVE) ×2
GOWN STRL REUS W/ TWL LRG LVL3 (GOWN DISPOSABLE) ×4 IMPLANT
GOWN STRL REUS W/ TWL XL LVL3 (GOWN DISPOSABLE) ×2 IMPLANT
GOWN STRL REUS W/TWL LRG LVL3 (GOWN DISPOSABLE) ×4
GOWN STRL REUS W/TWL XL LVL3 (GOWN DISPOSABLE) ×2
HALF PIN 5.0X160 (EXFIX) ×8 IMPLANT
KIT BASIN OR (CUSTOM PROCEDURE TRAY) ×4 IMPLANT
KIT TURNOVER KIT B (KITS) ×4 IMPLANT
MANIFOLD NEPTUNE II (INSTRUMENTS) IMPLANT
NEEDLE HYPO 21X1.5 SAFETY (NEEDLE) IMPLANT
NS IRRIG 1000ML POUR BTL (IV SOLUTION) ×4 IMPLANT
PACK GENERAL/GYN (CUSTOM PROCEDURE TRAY) ×4 IMPLANT
PACK ORTHO EXTREMITY (CUSTOM PROCEDURE TRAY) ×4 IMPLANT
PAD ARMBOARD 7.5X6 YLW CONV (MISCELLANEOUS) ×8 IMPLANT
PAD CAST 4YDX4 CTTN HI CHSV (CAST SUPPLIES) IMPLANT
PADDING CAST COTTON 4X4 STRL (CAST SUPPLIES)
PADDING CAST COTTON 6X4 STRL (CAST SUPPLIES) IMPLANT
PADDING UNDERCAST 2 STRL (CAST SUPPLIES) ×2
PADDING UNDERCAST 2X4 STRL (CAST SUPPLIES) ×2 IMPLANT
PIN 3MM (EXFIX) ×4 IMPLANT
PIN 4X100X20MM (EXFIX) ×4 IMPLANT
PIN CLAMP 2BAR 75MM BLUE (EXFIX) ×4 IMPLANT
PIN TRANSFIXING 5.0 (EXFIX) ×4 IMPLANT
SPONGE LAP 18X18 RF (DISPOSABLE) ×4 IMPLANT
STAPLER VISISTAT 35W (STAPLE) IMPLANT
SUCTION FRAZIER HANDLE 10FR (MISCELLANEOUS) ×2
SUCTION TUBE FRAZIER 10FR DISP (MISCELLANEOUS) ×2 IMPLANT
SUT ETHILON 3 0 PS 1 (SUTURE) IMPLANT
SUT PDS AB 2-0 CT1 27 (SUTURE) IMPLANT
SUT VIC AB 2-0 CT1 27 (SUTURE)
SUT VIC AB 2-0 CT1 TAPERPNT 27 (SUTURE) IMPLANT
TOWEL OR 17X24 6PK STRL BLUE (TOWEL DISPOSABLE) ×4 IMPLANT
TOWEL OR 17X26 10 PK STRL BLUE (TOWEL DISPOSABLE) ×8 IMPLANT
TUBE CONNECTING 12'X1/4 (SUCTIONS) ×1
TUBE CONNECTING 12X1/4 (SUCTIONS) ×3 IMPLANT
UNDERPAD 30X30 (UNDERPADS AND DIAPERS) ×4 IMPLANT
WATER STERILE IRR 1000ML POUR (IV SOLUTION) ×4 IMPLANT

## 2018-04-28 NOTE — Progress Notes (Signed)
[facility] Progress Note Patient Details:  [name] [name] [birth year] [id number]  Patient ID: [name] [name], male   DOB: 1970/11/03, 48 y.o.   MRN: [id number]   [name] [name] 04/28/2018, 6:33 PMThe hardware on patients bed has not been updated. Can not apply trapeze bar.

## 2018-04-28 NOTE — Anesthesia Procedure Notes (Signed)
Procedure Name: Intubation Performed by: Lovie Chol, CRNA Pre-anesthesia Checklist: Patient identified, Emergency Drugs available, Suction available, Patient being monitored and Timeout performed Patient Re-evaluated:Patient Re-evaluated prior to induction Oxygen Delivery Method: Circle system utilized Preoxygenation: Pre-oxygenation with 100% oxygen Induction Type: IV induction, Rapid sequence and Cricoid Pressure applied Laryngoscope Size: Miller and 3 Grade View: Grade I Tube type: Oral Tube size: 7.0 mm Number of attempts: 1 Airway Equipment and Method: Stylet Placement Confirmation: ETT inserted through vocal cords under direct vision,  breath sounds checked- equal and bilateral and CO2 detector Secured at: 21 cm Tube secured with: Tape Dental Injury: Teeth and Oropharynx as per pre-operative assessment

## 2018-04-28 NOTE — H&P (Addendum)
Orthopaedic Trauma Service H&P  Reason for Consult: Right tibial pilon and talar body fracture Referring Physician: Chaney Mallingavid Yao, MD  [name] SalinaKeith Elmquist is an 48 y.o. male.  HPI: Larey SeatFell from ladder over 6 ft onto concrete driveway. No LOC, no other inj, no nerve or vessel inj. Substantial fracture comminution of the joint surface with high energy extension up into the shaft, in addition to associated talus fracture. This very complex injury can often be associated with compartment syndrome that may result in deep infection or limb loss as a result of delay in management. Pain currently controlled at 5/10 deep and throbbing but had increased. No pain with passive stretch of toe flexors or extensors. As I was the on call orthopaedic traumatologist, I was asked to provide immediate consultation and further treatment.  History reviewed. No pertinent past medical history.  History reviewed. No pertinent surgical history.  History reviewed. No pertinent family history.  Social History:  reports that he has been smoking. He has never used smokeless tobacco. He reports current alcohol use. He reports that he does not use drugs.  Allergies: No Known Allergies  Medications:  Prior to Admission:  No medications prior to admission.    Results for orders placed or performed during the hospital encounter of 04/27/18 (from the past 48 hour(s))  CBC with Differential/Platelet     Status: Abnormal   Collection Time: 04/27/18  9:40 PM  Result Value Ref Range   WBC 7.7 4.0 - 10.5 K/uL   RBC 4.20 (L) 4.22 - 5.81 MIL/uL   Hemoglobin 13.6 13.0 - 17.0 g/dL   HCT 16.141.2 09.639.0 - 04.552.0 %   MCV 98.1 80.0 - 100.0 fL   MCH 32.4 26.0 - 34.0 pg   MCHC 33.0 30.0 - 36.0 g/dL   RDW 40.913.8 81.111.5 - 91.415.5 %   Platelets 263 150 - 400 K/uL   nRBC 0.0 0.0 - 0.2 %   Neutrophils Relative % 54 %   Neutro Abs 4.2 1.7 - 7.7 K/uL   Lymphocytes Relative 34 %   Lymphs Abs 2.6 0.7 - 4.0 K/uL   Monocytes Relative 9 %   Monocytes Absolute 0.7  0.1 - 1.0 K/uL   Eosinophils Relative 2 %   Eosinophils Absolute 0.1 0.0 - 0.5 K/uL   Basophils Relative 1 %   Basophils Absolute 0.1 0.0 - 0.1 K/uL   Immature Granulocytes 0 %   Abs Immature Granulocytes 0.03 0.00 - 0.07 K/uL    Comment: Performed at [facility], [address] [place], [place] [address]  Basic metabolic panel     Status: Abnormal   Collection Time: 04/27/18  9:40 PM  Result Value Ref Range   Sodium 133 (L) 135 - 145 mmol/L   Potassium 4.0 3.5 - 5.1 mmol/L   Chloride 100 98 - 111 mmol/L   CO2 24 22 - 32 mmol/L   Glucose, Bld 93 70 - 99 mg/dL   BUN 10 6 - 20 mg/dL   Creatinine, Ser 6.210.64 0.61 - 1.24 mg/dL   Calcium 8.5 (L) 8.9 - 10.3 mg/dL   GFR calc non Af Amer >60 >60 mL/min   GFR calc Af Amer >60 >60 mL/min   Anion gap 9 5 - 15    Comment: Performed at [facility], [address] [place], [place] [address]  Protime-INR     Status: None   Collection Time: 04/27/18  9:40 PM  Result Value Ref Range   Prothrombin Time  12.1 11.4 - 15.2 seconds   INR 0.9 0.8 - 1.2    Comment: (NOTE) INR goal varies based on device and disease states. Performed at [facility], [address] [place], [place] [address]   MRSA PCR Screening     Status: None   Collection Time: 04/28/18  5:28 AM  Result Value Ref Range   MRSA by PCR NEGATIVE NEGATIVE    Comment:        The GeneXpert MRSA Assay (FDA approved for NASAL specimens only), is one component of a comprehensive MRSA colonization surveillance program. It is not intended to diagnose MRSA infection nor to guide or monitor treatment for MRSA infections. Performed at [facility], [address], [place], [place] [address]     Dg Tibia/fibula Right  Result Date: 04/27/2018 CLINICAL DATA:  49 year old male status post fall from ladder. EXAM: RIGHT TIBIA AND FIBULA - 2 VIEW COMPARISON:  Right foot series today. FINDINGS: Alignment about the right knee is preserved and the  proximal right tibia and fibula appear intact. Comminuted spiral fracture of the distal right tibia shaft extending through the metadiaphysis to the plafond. Mild over riding, medial displacement, posterior displacement, lateral and posterior angulation. Superimposed mildly comminuted oblique fracture of the medial malleolus which is mildly distracted. Comminuted and impacted fracture of the distal right fibula metadiaphysis with mild medial displacement and posterior angulation. Comminution appears to extend into the lateral malleolus. The talar dome appears grossly intact, but the talus is fractured better demonstrated on the foot series today. No dislocation of the mortise joint. IMPRESSION: 1. Comminuted spiral fracture of the distal right tibia shaft extending to the plafond. Mild displacement and angulation. Associated oblique fracture of the medial malleolus. 2. Comminuted and impacted fracture of the distal right fibula metadiaphysis. 3. No dislocation of the mortise joint, but talus fracture better demonstrated on the foot series. Electronically Signed   By: Odessa Fleming M.D.   On: 04/27/2018 21:30   Ct Ankle Right Wo Contrast  Result Date: 04/27/2018 CLINICAL DATA:  No distal tibial and fibular fractures and findings suggestive of talar fracture on recent plain film EXAM: CT OF THE RIGHT ANKLE WITHOUT CONTRAST TECHNIQUE: Multidetector CT imaging of the right ankle was performed according to the standard protocol. Multiplanar CT image reconstructions were also generated. COMPARISON:  Plain films from earlier in the same day. FINDINGS: Bones/Joint/Cartilage Oblique fracture through the distal diaphysis of the tibia with extension to the articular surface and significant comminution distally along the lateral aspect of the tibia. Comminuted medial malleolar fracture is noted as well. No posterior malleolar fracture is identified. Significantly comminuted distal fibular fracture is noted similar to that seen on  prior plain film examination. Small bony fragments are noted within the tibiotalar articulation. There is a vertically oriented fracture through the talus posteriorly involving the posterolateral aspect of the talar dome. The changes seen on plain film anteriorly within the talus are projectional in nature and do not reflect actual talar fracture. The remainder of the tarsal bones are within normal limits. No calcaneal fracture is seen. Ligaments Suboptimally assessed by CT. Muscles and Tendons Edematous changes are noted within the surrounding musculature. No other muscular abnormality is seen. No hematoma is noted. Soft tissues Edematous changes as described above in the subcutaneous tissues related to the recent fracture. No other focal abnormality is noted. IMPRESSION: Severely comminuted fractures of the distal tibia and distal fibula with extension tibiotalar articulation with multiple small bony  fragments in the tibiotalar joint. Fracture through the lateral aspect of the talar dome is noted posteriorly with mild displacement. No other talar abnormality is noted. Electronically Signed   By: Alcide Clever M.D.   On: 04/27/2018 22:28   Dg Chest Port 1 View  Result Date: 04/27/2018 CLINICAL DATA:  48 y/o  M; fractures after fall.  Preop. EXAM: PORTABLE CHEST 1 VIEW COMPARISON:  None. FINDINGS: The heart size and mediastinal contours are within normal limits. Both lungs are clear. The visualized skeletal structures are unremarkable. IMPRESSION: No active disease. Electronically Signed   By: Mitzi Hansen M.D.   On: 04/27/2018 22:52   Dg Foot Complete Right  Result Date: 04/27/2018 CLINICAL DATA:  48 year old male status post fall from ladder. EXAM: RIGHT FOOT COMPLETE - 3+ VIEW COMPARISON:  Right tib-fib series today reported separately. FINDINGS: Comminuted fractures of the distal tibia and fibula with involvement of the mortise joint are reported separately. On the lateral view there is a  fracture through the anterior body of the talus which appears mildly comminuted and minimally displaced. The calcaneus appears to remain intact. The remaining tarsal bones appear intact and normally aligned. No metatarsal or phalanx fracture identified. IMPRESSION: 1. Comminuted and minimally displaced fracture through the anterior body of the talus seen on the lateral view. 2. No other acute fracture or dislocation identified in the right foot. 3. Comminuted, intra-articular distal tibia and fibula fractures reported separately. Electronically Signed   By: Odessa Fleming M.D.   On: 04/27/2018 21:33    ROS No recent fever, bleeding abnormalities, urologic dysfunction, GI problems, or weight gain.  Blood pressure 133/80, pulse 78, temperature 98.2 F (36.8 C), temperature source Oral, resp. rate 16, height 5\' 8"  (1.727 m), weight 66.2 kg, SpO2 97 %. Physical Exam NCAT RRR CTA S/NT/ND RLE No traumatic wounds, ecchymosis, or rash  Tender, swollen  No pain with passive stretch  No knee or ankle effusion  Knee stable to varus/ valgus and anterior/posterior stress  Sens DPN, SPN, TN intact  Motor EHL, ext, flex, evers intact grossly  No significant edema at toes, brisk cap refill   Assessment/Plan: Right tibia pilon, tibia and fibula and posterolateral talus fracture for immediate closed reduction, possible limited internal fixation, possible compartment release/ fasciotomies.  I discussed with the patient the risks and benefits of surgery, including the possibility of infection, nerve injury, vessel injury, wound breakdown, arthritis, symptomatic hardware, DVT/ PE, loss of motion, malunion, nonunion, and need for further surgery among others.  We also specifically discussed the need to stage surgery because of the elevated risk of soft tissue breakdown that could lead to amputation.  He acknowledged these risks and wished to proceed.  Myrene Galas, MD [facility],  [facility] [phone] 04/28/2018  8:12 AM

## 2018-04-28 NOTE — Transfer of Care (Signed)
Immediate Anesthesia Transfer of Care Note  Patient: Izaya Spearin  Procedure(s) Performed: External Fixation Leg of right ankle (Right Leg Lower)  Patient Location: PACU  Anesthesia Type:General  Level of Consciousness: oriented, drowsy and patient cooperative  Airway & Oxygen Therapy: Patient Spontanous Breathing and Patient connected to face mask oxygen  Post-op Assessment: Report given to RN and Post -op Vital signs reviewed and stable  Post vital signs: Reviewed  Last Vitals:  Vitals Value Taken Time  BP    Temp    Pulse 73 04/28/2018 10:09 AM  Resp 18 04/28/2018 10:09 AM  SpO2 100 % 04/28/2018 10:09 AM  Vitals shown include unvalidated device data.  Last Pain:  Vitals:   04/28/18 0623  TempSrc:   PainSc: 0-No pain         Complications: No apparent anesthesia complications

## 2018-04-28 NOTE — Anesthesia Procedure Notes (Signed)
Anesthesia Regional Block: Popliteal block   Pre-Anesthetic Checklist: ,, timeout performed, Correct Patient, Correct Site, Correct Laterality, Correct Procedure, Correct Position, site marked, Risks and benefits discussed,  Surgical consent,  Pre-op evaluation,  At surgeon's request and post-op pain management  Laterality: Right  Prep: chloraprep       Needles:  Injection technique: Single-shot  Needle Type: Echogenic Needle     Needle Length: 9cm      Additional Needles:   Procedures:,,,, ultrasound used (permanent image in chart),,,,  Narrative:  Start time: 04/28/2018 7:49 AM End time: 04/28/2018 7:59 AM Injection made incrementally with aspirations every 5 mL.  Performed by: Personally  Anesthesiologist: Eilene Ghazi, MD  Additional Notes: Patient tolerated the procedure well without complications

## 2018-04-28 NOTE — Plan of Care (Signed)
  Problem: Education: Goal: Knowledge of the prescribed therapeutic regimen will improve Outcome: Progressing   Problem: Activity: Goal: Ability to increase mobility will improve Outcome: Progressing   Problem: Physical Regulation: Goal: Postoperative complications will be avoided or minimized Outcome: Progressing   Problem: Pain Management: Goal: Pain level will decrease with appropriate interventions Outcome: Progressing   Problem: Skin Integrity: Goal: Will show signs of wound healing Outcome: Progressing   

## 2018-04-28 NOTE — Anesthesia Preprocedure Evaluation (Addendum)
Anesthesia Evaluation  Patient identified by MRN, date of birth, ID band Patient awake    Reviewed: Allergy & Precautions, NPO status , Patient's Chart, lab work & pertinent test results  Airway Mallampati: II  TM Distance: >3 FB Neck ROM: Full    Dental no notable dental hx. (+) Teeth Intact, Dental Advisory Given   Pulmonary Current Smoker,    Pulmonary exam normal breath sounds clear to auscultation       Cardiovascular negative cardio ROS Normal cardiovascular exam Rhythm:Regular Rate:Normal     Neuro/Psych negative neurological ROS  negative psych ROS   GI/Hepatic negative GI ROS, Neg liver ROS,   Endo/Other  negative endocrine ROS  Renal/GU negative Renal ROS  negative genitourinary   Musculoskeletal negative musculoskeletal ROS (+)   Abdominal   Peds negative pediatric ROS (+)  Hematology negative hematology ROS (+)   Anesthesia Other Findings   Reproductive/Obstetrics negative OB ROS                            Anesthesia Physical Anesthesia Plan  ASA: II  Anesthesia Plan: General   Post-op Pain Management:  Regional for Post-op pain   Induction: Intravenous and Rapid sequence  PONV Risk Score and Plan: Ondansetron, Treatment may vary due to age or medical condition and Dexamethasone  Airway Management Planned: Oral ETT  Additional Equipment:   Intra-op Plan:   Post-operative Plan: Extubation in OR  Informed Consent: I have reviewed the patients History and Physical, chart, labs and discussed the procedure including the risks, benefits and alternatives for the proposed anesthesia with the patient or authorized representative who has indicated his/her understanding and acceptance.     Dental advisory given  Plan Discussed with: CRNA and Surgeon  Anesthesia Plan Comments:         Anesthesia Quick Evaluation

## 2018-04-28 NOTE — Anesthesia Postprocedure Evaluation (Signed)
Anesthesia Post Note  Patient: [name] [name]  Procedure(s) Performed: External Fixation Leg of right ankle (Right Leg Lower)     Patient location during evaluation: PACU Anesthesia Type: General Level of consciousness: awake and alert Pain management: pain level controlled Vital Signs Assessment: post-procedure vital signs reviewed and stable Respiratory status: spontaneous breathing, nonlabored ventilation, respiratory function stable and patient connected to nasal cannula oxygen Cardiovascular status: blood pressure returned to baseline and stable Postop Assessment: no apparent nausea or vomiting Anesthetic complications: no    Last Vitals:  Vitals:   04/28/18 1009 04/28/18 1010  BP:  127/86  Pulse:  74  Resp:  16  Temp: 36.7 C   SpO2:  100%    Last Pain:  Vitals:   04/28/18 1009  TempSrc:   PainSc: 0-No pain                 Megann Easterwood S

## 2018-04-28 NOTE — Progress Notes (Signed)
RN called report to [place], RN in [place].

## 2018-04-29 ENCOUNTER — Encounter (HOSPITAL_COMMUNITY): Payer: Self-pay | Admitting: Orthopedic Surgery

## 2018-04-29 DIAGNOSIS — Z720 Tobacco use: Secondary | ICD-10-CM | POA: Diagnosis present

## 2018-04-29 HISTORY — DX: Tobacco use: Z72.0

## 2018-04-29 LAB — COMPREHENSIVE METABOLIC PANEL
ALT: 15 U/L (ref 0–44)
AST: 21 U/L (ref 15–41)
Albumin: 3.6 g/dL (ref 3.5–5.0)
Alkaline Phosphatase: 44 U/L (ref 38–126)
Anion gap: 11 (ref 5–15)
BUN: 9 mg/dL (ref 6–20)
CO2: 23 mmol/L (ref 22–32)
Calcium: 8.7 mg/dL — ABNORMAL LOW (ref 8.9–10.3)
Chloride: 103 mmol/L (ref 98–111)
Creatinine, Ser: 0.74 mg/dL (ref 0.61–1.24)
GFR calc Af Amer: >60 mL/min (ref >60)
GFR calc non Af Amer: >60 mL/min (ref >60)
Glucose, Bld: 97 mg/dL (ref 70–99)
Potassium: 3.6 mmol/L (ref 3.5–5.1)
Sodium: 137 mmol/L (ref 135–145)
Total Bilirubin: 1 mg/dL (ref 0.3–1.2)
Total Protein: 6.6 g/dL (ref 6.5–8.1)

## 2018-04-29 LAB — CBC
HCT: 36.4 % — ABNORMAL LOW (ref 39.0–52.0)
Hemoglobin: 12.3 g/dL — ABNORMAL LOW (ref 13.0–17.0)
MCH: 32.9 pg (ref 26.0–34.0)
MCHC: 33.8 g/dL (ref 30.0–36.0)
MCV: 97.3 fL (ref 80.0–100.0)
Platelets: 257 10*3/uL (ref 150–400)
RBC: 3.74 MIL/uL — ABNORMAL LOW (ref 4.22–5.81)
RDW: 13.6 % (ref 11.5–15.5)
WBC: 9.5 10*3/uL (ref 4.0–10.5)
nRBC: 0 % (ref 0.0–0.2)

## 2018-04-29 MED ORDER — VITAMIN D 25 MCG (1000 UNIT) PO TABS
2000.0000 [IU] | ORAL_TABLET | Freq: Two times a day (BID) | ORAL | Status: DC
  Administered 2018-04-29: 2000 [IU] via ORAL
  Filled 2018-04-29: qty 2

## 2018-04-29 MED ORDER — METHOCARBAMOL 750 MG PO TABS: 750.0000 mg | ORAL_TABLET | Freq: Three times a day (TID) | ORAL | 0 refills | Status: DC | PRN

## 2018-04-29 MED ORDER — VITAMIN D 125 MCG (5000 UT) PO CAPS: 1.0000 | ORAL_CAPSULE | Freq: Every day | ORAL | 2 refills | Status: AC

## 2018-04-29 MED ORDER — HYDROCODONE-ACETAMINOPHEN 7.5-325 MG PO TABS: 1.0000 | ORAL_TABLET | Freq: Four times a day (QID) | ORAL | 0 refills | Status: DC | PRN

## 2018-04-29 MED ORDER — ENOXAPARIN (LOVENOX) PATIENT EDUCATION KIT: 1.0000 | PACK | Freq: Once | 0 refills | Status: AC

## 2018-04-29 MED ORDER — GABAPENTIN 300 MG PO CAPS: 300.0000 mg | ORAL_CAPSULE | Freq: Two times a day (BID) | ORAL | 0 refills | Status: AC

## 2018-04-29 MED ORDER — ENOXAPARIN (LOVENOX) PATIENT EDUCATION KIT: PACK | Freq: Once | Status: DC

## 2018-04-29 MED ORDER — ACETAMINOPHEN 500 MG PO TABS: 500.0000 mg | ORAL_TABLET | Freq: Two times a day (BID) | ORAL | 0 refills | Status: AC

## 2018-04-29 MED ORDER — ENOXAPARIN SODIUM 40 MG/0.4ML ~~LOC~~ SOLN: 40.0000 mg | SUBCUTANEOUS | 0 refills | Status: DC

## 2018-04-29 MED ORDER — VITAMIN C 500 MG PO TABS
1000.0000 mg | ORAL_TABLET | Freq: Every day | ORAL | Status: DC
  Administered 2018-04-29: 1000 mg via ORAL
  Filled 2018-04-29: qty 2

## 2018-04-29 MED ORDER — DOCUSATE SODIUM 100 MG PO CAPS: 100.0000 mg | ORAL_CAPSULE | Freq: Two times a day (BID) | ORAL | 0 refills | Status: DC

## 2018-04-29 MED ORDER — ASCORBIC ACID 1000 MG PO TABS: 1000.0000 mg | ORAL_TABLET | Freq: Every day | ORAL | 1 refills | Status: AC

## 2018-04-29 NOTE — Plan of Care (Signed)
  Problem: Activity: Goal: Ability to increase mobility will improve Outcome: Progressing   Problem: Physical Regulation: Goal: Postoperative complications will be avoided or minimized Outcome: Progressing   Problem: Pain Management: Goal: Pain level will decrease with appropriate interventions Outcome: Progressing   

## 2018-04-29 NOTE — Progress Notes (Signed)
Orthopedic Trauma Service Progress Note  Patient ID: [name] [name] MRN: [id number] DOB/AGE: [birth year] 48 y.o.  Subjective:  Doing great  Ambulated using crutches  Pain controlled, did have some issues last night but much better  No other complaints  Wants to go home  Tolerating regular diet + void, + flatus   Review of Systems  Constitutional: Negative for chills and fever.  Respiratory: Negative for shortness of breath and wheezing.   Cardiovascular: Negative for chest pain and palpitations.  Gastrointestinal: Negative for abdominal pain, nausea and vomiting.    Objective:   VITALS:   Vitals:   04/28/18 2112 04/29/18 0206 04/29/18 0451 04/29/18 1306  BP: 127/83 (!) 145/87 (!) 143/93 (!) 142/92  Pulse: 90 69 67 76  Resp: 18 18 14 16   Temp: 98.4 F (36.9 C) 98.2 F (36.8 C) 98 F (36.7 C) 98.3 F (36.8 C)  TempSrc: Oral Oral Oral Oral  SpO2: 98% 99% 99% 99%  Weight:      Height:        Estimated body mass index is 22.2 kg/m as calculated from the following:   Height as of this encounter: 5\' 8"  (1.727 m).   Weight as of this encounter: 66.2 kg.   Intake/Output      04/19 0701 - 04/20 0700 04/20 0701 - 04/21 0700   P.O. 1040 720   I.V. (mL/kg) 1200 (18.1)    IV Piggyback 150    Total Intake(mL/kg) 2390 (36.1) 720 (10.9)   Urine (mL/kg/hr) 3800 (2.4) 1000 (2)   Blood 20    Total Output 3820 1000   Net -1430 -280          LABS  Results for orders placed or performed during the hospital encounter of 04/27/18 (from the past 24 hour(s))  Comprehensive metabolic panel     Status: Abnormal   Collection Time: 04/29/18  4:48 AM  Result Value Ref Range   Sodium 137 135 - 145 mmol/L   Potassium 3.6 3.5 - 5.1 mmol/L   Chloride 103 98 - 111 mmol/L   CO2 23 22 - 32 mmol/L   Glucose, Bld 97 70 - 99 mg/dL   BUN 9 6 - 20 mg/dL   Creatinine, Ser 4.09 0.61 - 1.24 mg/dL   Calcium 8.7 (L) 8.9  - 10.3 mg/dL   Total Protein 6.6 6.5 - 8.1 g/dL   Albumin 3.6 3.5 - 5.0 g/dL   AST 21 15 - 41 U/L   ALT 15 0 - 44 U/L   Alkaline Phosphatase 44 38 - 126 U/L   Total Bilirubin 1.0 0.3 - 1.2 mg/dL   GFR calc non Af Amer >60 >60 mL/min   GFR calc Af Amer >60 >60 mL/min   Anion gap 11 5 - 15  CBC     Status: Abnormal   Collection Time: 04/29/18  4:48 AM  Result Value Ref Range   WBC 9.5 4.0 - 10.5 K/uL   RBC 3.74 (L) 4.22 - 5.81 MIL/uL   Hemoglobin 12.3 (L) 13.0 - 17.0 g/dL   HCT 81.1 (L) 91.4 - 78.2 %   MCV 97.3 80.0 - 100.0 fL   MCH 32.9 26.0 - 34.0 pg   MCHC 33.8 30.0 - 36.0 g/dL   RDW 95.6 21.3 - 08.6 %   Platelets 257 150 -  400 K/uL   nRBC 0.0 0.0 - 0.2 %     PHYSICAL EXAM:   Gen: awake, alert, sitting up in bed, eating lunch  Lungs: unlabored Cardiac: RRR Abd: NTND, +BS  Ext:       Right Lower Extremity   Ex fix intact  Blood drainage at pinsites and on gauze  Dressing removed  Moderate swelling to ankle persists  No skin wrinkling  DPN, SPN, TN sensation grossly intact  EHL, FHL, lesser toe motor intact  No DCT   No pain with passive stretching or manipulation of ex fix   + DP pulse  Ext warm, good distal perfusion   Assessment/Plan: 1 Day Post-Op   Principal Problem:   Closed right pilon fracture Active Problems:   Tibial fracture   Current nicotine use   Anti-infectives (From admission, onward)   Start     Dose/Rate Route Frequency Ordered Stop   04/28/18 1430  ceFAZolin (ANCEF) IVPB 1 g/50 mL premix     1 g 100 mL/hr over 30 Minutes Intravenous Every 6 hours 04/28/18 1157 04/29/18 0240   04/28/18 0600  ceFAZolin (ANCEF) IVPB 2g/100 mL premix     2 g 200 mL/hr over 30 Minutes Intravenous On call to O.R. 04/27/18 2217 04/28/18 [id number]    .  POD/HD#: 1  48 y/o male s/p fall off ladder with closed R pilon fracture and closed R tibial shaft fracture   - fall off ladder  - closed comminuted R pilon and tibial shaft fractures s/p external fixation    NWB R leg  Aggressive ice and elevation of R extremity   Reviewed pin care and dressing changes with pt  Compression to R foot and ankle as well  Ok to move toes and knee   Follow up in 1 week with ortho for skin check   Definitive fixation pending swelling improvement    Ok to shower with fixator and to clean pinsites with soap and water   See dc instructions for very detailed wound and pinsite care   - Pain management:  norco   Robaxin  - ABL anemia/Hemodynamics  Stable  - Medical issues   Nicotine use   Discussed negative effects of nicotine on bone and wound healing. It can also contribute to prolonged swelling   - DVT/PE prophylaxis:  Lovenox x 4 weeks  - ID:   periop abx completed   - Metabolic Bone Disease:  Vitamin d labs ordered, have not been drawn for some reason  Put in stat order before discharge  - Activity:  Activity as tolerated while maintaining NWB R leg   - FEN/GI prophylaxis/Foley/Lines:  Reg diet  Dc IVF and IV  -Ex-fix/Splint care:  Ok to manipulate extremity by fixator  Ok to shower with soap and water, remove all dressings before showering   - Impediments to fracture healing:  High energy articular injury  Significant soft tissue injury   Nicotine use   - Dispo:  Dc home today   Follow up with ortho in 1 week     Mearl LatinKeith W. Alam Guterrez, PA-C [phone] (C) 04/29/2018, 2:33 PM  [facility] [address] [place] [address] [phone] Collier Bullock(O) [phone] (F)

## 2018-04-29 NOTE — Evaluation (Signed)
Physical Therapy Evaluation Patient Details Name: [name] [name] MRN: [id number] DOB: 07/13/1970 Today's Date: 04/29/2018   History of Present Illness  Patient presented to the hospital following a fall from a 6 foot ladder onto concrete. He suffered a tibi/fibula fx, and a talus fx. He underwnet external fixation and faciotomies on 04/28/2018. He will come back to the hospital in 2 weeks for fixation with plates and screws.   Clinical Impression  Patient was able to get up and ambulate around the room with crutches and supervision. He required min verbal cues for technique with crutches but did well after. He later worked with OT with the walker and felt that the walker may be the safest for him getting around the house. He does not feel like he will need a wheelchair at home. He will come back in two weeks to have his ankle fixated. He was advised at that time if he feels he could use the wheelchair to let his case manager know. He does not require skilled acute therapy at this time as he is likley at his baseline for what he can do right now.     Follow Up Recommendations No PT follow up    Equipment Recommendations  Rolling walker with 5" wheels    Recommendations for Other Services       Precautions / Restrictions Precautions Precautions: None Restrictions Weight Bearing Restrictions: Yes RLE Weight Bearing: Non weight bearing      Mobility  Bed Mobility Overal bed mobility: Independent             General bed mobility comments: able to sit up without increased pain and without assistance   Transfers Overall transfer level: Needs assistance Equipment used: Crutches Transfers: Sit to/from Stand Sit to Stand: Supervision         General transfer comment: min cuing for sit to stand transfer. Once shown how to stand with crutches he had no difficulty transfering.   Ambulation/Gait Ambulation/Gait assistance: Supervision Gait Distance (Feet): 20 Feet Assistive device:  Crutches Gait Pattern/deviations: Step-to pattern Gait velocity: decreased Gait velocity interpretation: <1.31 ft/sec, indicative of household ambulator General Gait Details: Patient has used crutches in the past. He was able to navigate the room safely with the crutches. Patient later attmepted using a rolling walker with OT and felt that that was the safer option for him at home.   Stairs            Wheelchair Mobility    Modified Rankin (Stroke Patients Only)       Balance Overall balance assessment: Needs assistance Sitting-balance support: No upper extremity supported Sitting balance-Leahy Scale: Good     Standing balance support: Bilateral upper extremity supported Standing balance-Leahy Scale: Poor                               Pertinent Vitals/Pain Pain Assessment: Faces Faces Pain Scale: Hurts a little bit Pain Location: right LE  Pain Descriptors / Indicators: Aching Pain Intervention(s): Limited activity within patient's tolerance;Monitored during session;Repositioned    Home Living Family/patient expects to be discharged to:: Private residence Living Arrangements: Spouse/significant other Available Help at Discharge: Family Type of Home: House Home Access: Stairs to enter Entrance Stairs-Rails: Can reach both Entrance Stairs-Number of Steps: 2 Home Layout: Two level   Additional Comments: Patients bedroom is on the first level. Has his wife and 4 teenage children to help him at home.  Prior Function Level of Independence: Independent               Hand Dominance        Extremity/Trunk Assessment   Upper Extremity Assessment Upper Extremity Assessment: Defer to OT evaluation    Lower Extremity Assessment Lower Extremity Assessment: RLE deficits/detail RLE: Unable to fully assess due to pain;Unable to fully assess due to immobilization    Cervical / Trunk Assessment Cervical / Trunk Assessment: Normal  Communication    Communication: No difficulties  Cognition Arousal/Alertness: Awake/alert Behavior During Therapy: WFL for tasks assessed/performed Overall Cognitive Status: Within Functional Limits for tasks assessed                                        General Comments General comments (skin integrity, edema, etc.): external fixator on the left leg     Exercises     Assessment/Plan    PT Assessment Patent does not need any further PT services  PT Problem List         PT Treatment Interventions      PT Goals (Current goals can be found in the Care Plan section)  Acute Rehab PT Goals Patient Stated Goal: to go home  PT Goal Formulation: With patient Time For Goal Achievement: 05/06/18 Potential to Achieve Goals: Good    Frequency     Barriers to discharge        Co-evaluation               AM-PAC PT "6 Clicks" Mobility  Outcome Measure Help needed turning from your back to your side while in a flat bed without using bedrails?: None Help needed moving from lying on your back to sitting on the side of a flat bed without using bedrails?: None Help needed moving to and from a bed to a chair (including a wheelchair)?: A Little Help needed standing up from a chair using your arms (e.g., wheelchair or bedside chair)?: A Little Help needed to walk in hospital room?: A Little Help needed climbing 3-5 steps with a railing? : A Little 6 Click Score: 20    End of Session Equipment Utilized During Treatment: Gait belt Activity Tolerance: Patient tolerated treatment well Patient left: in bed;with call bell/phone within reach(LE elevated ) Nurse Communication: Mobility status PT Visit Diagnosis: Other abnormalities of gait and mobility (R26.89)    Time: 3403-7096 PT Time Calculation (min) (ACUTE ONLY): 20 min   Charges:   PT Evaluation $PT Eval Low Complexity: 1 Low           Dessie Coma PT DPT  04/29/2018, 10:52 AM

## 2018-04-29 NOTE — Plan of Care (Signed)
°  Problem: Education: °Goal: Knowledge of the prescribed therapeutic regimen will improve °Outcome: Progressing °  °Problem: Physical Regulation: °Goal: Postoperative complications will be avoided or minimized °Outcome: Progressing °  °Problem: Pain Management: °Goal: Pain level will decrease with appropriate interventions °Outcome: Progressing °  °

## 2018-04-29 NOTE — Discharge Instructions (Signed)
Orthopaedic Trauma Service Discharge Instructions   General Discharge Instructions  WEIGHT BEARING STATUS: Nonweightbearing right leg  RANGE OF MOTION/ACTIVITY: Unrestricted range of motion right toes and right knee.  Activity as tolerated while maintaining weightbearing restrictions.  Ice and elevate extremity above heart as much as possible to help with swelling control  Bone health: Would recommend continuing vitamin D3 5000 IUs daily and vitamin C 1000 mg daily until instructed to stop  Wound Care: See instructions below.  May begin pin care on 04/30/2018.  Okay to periodically take off Ace wrap to allow soft tissue degrees.  This would be best done when leg is elevated  Discharge Pin Site Instructions  Dress pins daily with Kerlix roll starting on POD 2. Wrap the Kerlix so that it tamps the skin down around the pin-skin interface to prevent/limit motion of the skin relative to the pin.  (Pin-skin motion is the primary cause of pain and infection related to external fixator pin sites).  Remove any crust or coagulum that may obstruct drainage with a saline moistened gauze or soap and water.  After POD 3, if there is no discernable drainage on the pin site dressing, the interval for change can by increased to every other day.  You may shower with the fixator, cleaning all pin sites gently with soap and water.  If you have a surgical wound this needs to be completely dry and without drainage before showering.  The extremity can be lifted by the fixator to facilitate wound care and transfers.  Notify the office/Doctor if you experience increasing drainage, redness, or pain from a pin site, or if you notice purulent (thick, snot-like) drainage.  Discharge Wound Care Instructions  Do NOT apply any ointments, solutions or lotions to pin sites or surgical wounds.  These prevent needed drainage and even though solutions like hydrogen peroxide kill bacteria, they also damage cells lining  the pin sites that help fight infection.  Applying lotions or ointments can keep the wounds moist and can cause them to breakdown and open up as well. This can increase the risk for infection. When in doubt call the office.  Surgical incisions should be dressed daily.  If any drainage is noted, use one layer of adaptic, then gauze, Kerlix, and an ace wrap.  Once the incision is completely dry and without drainage, it may be left open to air out.  Showering may begin 36-48 hours later.  Cleaning gently with soap and water.  Traumatic wounds should be dressed daily as well.    One layer of adaptic, gauze, Kerlix, then ace wrap.  The adaptic can be discontinued once the draining has ceased    If you have a wet to dry dressing: wet the gauze with saline the squeeze as much saline out so the gauze is moist (not soaking wet), place moistened gauze over wound, then place a dry gauze over the moist one, followed by Kerlix wrap, then ace wrap.  DVT/PE prophylaxis: Lovenox 40 mg injection daily x 30 days.  This is to help prevent the development of blood clots  Diet: as you were eating previously.  Can use over the counter stool softeners and bowel preparations, such as Miralax, to help with bowel movements.  Narcotics can be constipating.  Be sure to drink plenty of fluids  PAIN MEDICATION USE AND EXPECTATIONS  You have likely been given narcotic medications to help control your pain.  After a traumatic event that results in an fracture (broken bone)  with or without surgery, it is ok to use narcotic pain medications to help control one's pain.  We understand that everyone responds to pain differently and each individual patient will be evaluated on a regular basis for the continued need for narcotic medications. Ideally, narcotic medication use should last no more than 6-8 weeks (coinciding with fracture healing).   As a patient it is your responsibility as well to monitor narcotic medication use and  report the amount and frequency you use these medications when you come to your office visit.   We would also advise that if you are using narcotic medications, you should take a dose prior to therapy to maximize you participation.  IF YOU ARE ON NARCOTIC MEDICATIONS IT IS NOT PERMISSIBLE TO OPERATE A MOTOR VEHICLE (MOTORCYCLE/CAR/TRUCK/MOPED) OR HEAVY MACHINERY DO NOT MIX NARCOTICS WITH OTHER CNS (CENTRAL NERVOUS SYSTEM) DEPRESSANTS SUCH AS ALCOHOL   STOP SMOKING OR USING NICOTINE PRODUCTS!!!!  As discussed nicotine severely impairs your body's ability to heal surgical and traumatic wounds but also impairs bone healing.  Wounds and bone heal by forming microscopic blood vessels (angiogenesis) and nicotine is a vasoconstrictor (essentially, shrinks blood vessels).  Therefore, if vasoconstriction occurs to these microscopic blood vessels they essentially disappear and are unable to deliver necessary nutrients to the healing tissue.  This is one modifiable factor that you can do to dramatically increase your chances of healing your injury.    (This means no smoking, no nicotine gum, patches, etc)  DO NOT USE NONSTEROIDAL ANTI-INFLAMMATORY DRUGS (NSAID'S)  Using products such as Advil (ibuprofen), Aleve (naproxen), Motrin (ibuprofen) for additional pain control during fracture healing can delay and/or prevent the healing response.  If you would like to take over the counter (OTC) medication, Tylenol (acetaminophen) is ok.  However, some narcotic medications that are given for pain control contain acetaminophen as well. Therefore, you should not exceed more than 4000 mg of tylenol in a day if you do not have liver disease.  Also note that there are may OTC medicines, such as cold medicines and allergy medicines that my contain tylenol as well.  If you have any questions about medications and/or interactions please ask your doctor/PA or your pharmacist.      ICE AND ELEVATE INJURED/OPERATIVE  EXTREMITY  Using ice and elevating the injured extremity above your heart can help with swelling and pain control.  Icing in a pulsatile fashion, such as 20 minutes on and 20 minutes off, can be followed.    Do not place ice directly on skin. Make sure there is a barrier between to skin and the ice pack.    Using frozen items such as frozen peas works well as the conform nicely to the are that needs to be iced.  USE AN ACE WRAP OR TED HOSE FOR SWELLING CONTROL  In addition to icing and elevation, Ace wraps or TED hose are used to help limit and resolve swelling.  It is recommended to use Ace wraps or TED hose until you are informed to stop.    When using Ace Wraps start the wrapping distally (farthest away from the body) and wrap proximally (closer to the body)   Example: If you had surgery on your leg or thing and you do not have a splint on, start the ace wrap at the toes and work your way up to the thigh        If you had surgery on your upper extremity and do not have a splint  on, start the ace wrap at your fingers and work your way up to the upper arm  IF YOU ARE IN A SPLINT OR CAST DO NOT REMOVE IT FOR ANY REASON   If your splint gets wet for any reason please contact the office immediately. You may shower in your splint or cast as long as you keep it dry.  This can be done by wrapping in a cast cover or garbage back (or similar)  Do Not stick any thing down your splint or cast such as pencils, money, or hangers to try and scratch yourself with.  If you feel itchy take benadryl as prescribed on the bottle for itching  IF YOU ARE IN A CAM BOOT (BLACK BOOT)  You may remove boot periodically. Perform daily dressing changes as noted below.  Wash the liner of the boot regularly and wear a sock when wearing the boot. It is recommended that you sleep in the boot until told otherwise  CALL THE OFFICE WITH ANY QUESTIONS OR CONCERNS: [phone]

## 2018-04-29 NOTE — Discharge Summary (Signed)
Orthopaedic Trauma Service (OTS) Discharge Summary   Patient ID: [name] [name] MRN: [id number] DOB/AGE: 1970-09-20 48 y.o.  Admit date: 04/27/2018 Discharge date: 04/30/2018  Admission Diagnoses: Fall off ladder Closed right pilon fracture Right tibial shaft fracture Right lateral talar process fracture Nicotine use  Discharge Diagnoses:  Principal Problem:   Closed right pilon fracture Active Problems:   Tibial fracture   Current nicotine use   Talus fracture, right lateral talar process   Past Medical History:  Diagnosis Date   Current nicotine use 04/29/2018   Talus fracture, right lateral talar process 04/30/2018     Procedures Performed: 04/28/2018-Dr. Marcelino Scot External fixation right tibial plafond and tibial shaft fracture  Discharged Condition: good  Hospital Course:  48 year old white male who sustained a significant injury to his right distal tibia late evening on 04/27/2018.  Patient was a ladder over 6 feet over his concrete driveway trying to clear some limbs from his neighbors tree that were encroaching onto his property.  Patient somehow fell off of the ladder landing directly on his right ankle.  Patient immediate onset of pain and inability to bear weight.  He was seen and evaluated at [facility].  He was found to have isolated injury to his right distal fibula, tibia shaft as well as talus.  Patient was splinted and then transferred over to [facility] for further evaluation.  Patient was seen in the preoperative holding area where he was noted to have extensive swelling.  Patient was taken to the operating room for application of a spanning external fixator.  After surgery patient was transferred to the orthopedic floor for continued observation, pain control and to monitor his compartments.  Patient did very well on postop day 0 as well as postop day 1.  On postop day #1 during rounds he was sitting up in bed had very good pain control and has mobilized  well with therapy.  Patient wished to be discharged home on postoperative day #1.  Patient had adequate pain control with oral pain medications and was mobilizing beautifully there were no other clinical indicators to keep him hospitalized any longer.  On 04/29/2018 patient discharged stable condition to home with DME.  Patient was covered with perioperative antibiotics for routine coverage.  He was started on Lovenox for DVT and PE prophylaxis.  We discussed proper wound care pertaining to his external fixator.  He is to review his discharge instructions as there are very detailed wound care/external fixator care instructions and discharge packet.  Patient was started on vitamin D and vitamin C to encourage good bone and soft tissue healing.  We discussed the importance of maintaining strict ice and elevation in an effort to help his swelling is.  He should continue to focus on elevating his right leg above his heart as much as possible as well as moving his toes and continue to use compression by way of Ace wrap.  We are hopeful that we will be able to return to the OR in 10 to 14 days for definitive fixation as there is still displacement present at the joint line as well as along the tibial shaft and fibular shaft.    Patient has a very complex right tibial plafond fracture involving both his distal tibia and his distal fibula.  This is a fracture that is best treated by an orthopedic trauma fellowship trained surgeon which Dr. Marcelino Scot is.  Our practice exclusively deals with highly complex fracture patterns which the patient has.  Consults: None  Significant Diagnostic Studies: labs:   Results for [name], [name] (MRN [id number]) as of 04/30/2018 12:46  Ref. Range 04/29/2018 04:48 04/29/2018 14:48  Sodium Latest Ref Range: 135 - 145 mmol/L 137   Potassium Latest Ref Range: 3.5 - 5.1 mmol/L 3.6   Chloride Latest Ref Range: 98 - 111 mmol/L 103   CO2 Latest Ref Range: 22 - 32 mmol/L 23   Glucose Latest  Ref Range: 70 - 99 mg/dL 97   BUN Latest Ref Range: 6 - 20 mg/dL 9   Creatinine Latest Ref Range: 0.61 - 1.24 mg/dL 0.74   Calcium Latest Ref Range: 8.9 - 10.3 mg/dL 8.7 (L)   Anion gap Latest Ref Range: 5 - 15  11   Alkaline Phosphatase Latest Ref Range: 38 - 126 U/L 44   Albumin Latest Ref Range: 3.5 - 5.0 g/dL 3.6   AST Latest Ref Range: 15 - 41 U/L 21   ALT Latest Ref Range: 0 - 44 U/L 15   Total Protein Latest Ref Range: 6.5 - 8.1 g/dL 6.6   Total Bilirubin Latest Ref Range: 0.3 - 1.2 mg/dL 1.0   GFR, Est Non African American Latest Ref Range: >60 mL/min >60   GFR, Est African American Latest Ref Range: >60 mL/min >60             WBC Latest Ref Range: 4.0 - 10.5 K/uL 9.5   RBC Latest Ref Range: 4.22 - 5.81 MIL/uL 3.74 (L)   Hemoglobin Latest Ref Range: 13.0 - 17.0 g/dL 12.3 (L)   HCT Latest Ref Range: 39.0 - 52.0 % 36.4 (L)   MCV Latest Ref Range: 80.0 - 100.0 fL 97.3   MCH Latest Ref Range: 26.0 - 34.0 pg 32.9   MCHC Latest Ref Range: 30.0 - 36.0 g/dL 33.8   RDW Latest Ref Range: 11.5 - 15.5 % 13.6   Platelets Latest Ref Range: 150 - 400 K/uL 257   nRBC Latest Ref Range: 0.0 - 0.2 % 0.0     Treatments: IV hydration, antibiotics: Ancef, analgesia: Norco, Dilaudid, Neurontin, Robaxin, anticoagulation: LMW heparin, therapies: PT, OT and RN and surgery: As above  Discharge Exam:    Orthopedic Trauma Service Progress Note   Patient ID: [name] [name] MRN: [id number] DOB/AGE: 06/16/1970 48 y.o.   Subjective:   Doing great  Ambulated using crutches  Pain controlled, did have some issues last night but much better   No other complaints   Wants to go home   Tolerating regular diet + void, + flatus    Review of Systems  Constitutional: Negative for chills and fever.  Respiratory: Negative for shortness of breath and wheezing.   Cardiovascular: Negative for chest pain and palpitations.  Gastrointestinal: Negative for abdominal pain, nausea and vomiting.      Objective:     VITALS:         Vitals:    04/28/18 2112 04/29/18 0206 04/29/18 0451 04/29/18 1306  BP: 127/83 (!) 145/87 (!) 143/93 (!) 142/92  Pulse: 90 69 67 76  Resp: _0 Temp: 98.4 F (36.9 C) 98.2 F (36.8 C) 98 F (36.7 C) 98.3 F (36.8 C)  TempSrc: Oral Oral Oral Oral  SpO2: 98% 99% 99% 99%  Weight:          Height:              Estimated body mass index is 22.2 kg/m as calculated from the following:   Height as of  this encounter: _0  (1.727 m).   Weight as of this encounter: 66.2 kg.     Intake/Output      04/19 0701 - 04/20 0700 04/20 0701 - 04/21 0700   P.O. 1040 720   I.V. (mL/kg) 1200 (18.1)    IV Piggyback 150    Total Intake(mL/kg) 2390 (36.1) 720 (10.9)   Urine (mL/kg/hr) 3800 (2.4) 1000 (2)   Blood 20    Total Output 3820 1000   Net -1430 -280           LABS   NewLab Results Last 24 Hours       Results for orders placed or performed during the hospital encounter of 04/27/18 (from the past 24 hour(s))  Comprehensive metabolic panel     Status: Abnormal    Collection Time: 04/29/18  4:48 AM  Result Value Ref Range    Sodium 137 135 - 145 mmol/L    Potassium 3.6 3.5 - 5.1 mmol/L    Chloride 103 98 - 111 mmol/L    CO2 23 22 - 32 mmol/L    Glucose, Bld 97 70 - 99 mg/dL    BUN 9 6 - 20 mg/dL    Creatinine, Ser 0.74 0.61 - 1.24 mg/dL    Calcium 8.7 (L) 8.9 - 10.3 mg/dL    Total Protein 6.6 6.5 - 8.1 g/dL    Albumin 3.6 3.5 - 5.0 g/dL    AST 21 15 - 41 U/L    ALT 15 0 - 44 U/L    Alkaline Phosphatase 44 38 - 126 U/L    Total Bilirubin 1.0 0.3 - 1.2 mg/dL    GFR calc non Af Amer >60 >60 mL/min    GFR calc Af Amer >60 >60 mL/min    Anion gap 11 5 - 15  CBC     Status: Abnormal    Collection Time: 04/29/18  4:48 AM  Result Value Ref Range    WBC 9.5 4.0 - 10.5 K/uL    RBC 3.74 (L) 4.22 - 5.81 MIL/uL    Hemoglobin 12.3 (L) 13.0 - 17.0 g/dL    HCT 36.4 (L) 39.0 - 52.0 %    MCV 97.3 80.0 - 100.0 fL    MCH 32.9 26.0 - 34.0 pg    MCHC 33.8 30.0  - 36.0 g/dL    RDW 13.6 11.5 - 15.5 %    Platelets 257 150 - 400 K/uL    nRBC 0.0 0.0 - 0.2 %          PHYSICAL EXAM:    Gen: awake, alert, sitting up in bed, eating lunch  Lungs: unlabored Cardiac: RRR Abd: NTND, +BS  Ext:       Right Lower Extremity              Ex fix intact             Blood drainage at pinsites and on gauze             Dressing removed             Moderate swelling to ankle persists             No skin wrinkling             DPN, SPN, TN sensation grossly intact             EHL, FHL, lesser toe motor intact             No  DCT              No pain with passive stretching or manipulation of ex fix              + DP pulse             Ext warm, good distal perfusion    Assessment/Plan: 1 Day Post-Op    Principal Problem:   Closed right pilon fracture Active Problems:   Tibial fracture   Current nicotine use                Anti-infectives (From admission, onward)    Start     Dose/Rate Route Frequency Ordered Stop    04/28/18 1430   ceFAZolin (ANCEF) IVPB 1 g/50 mL premix     1 g 100 mL/hr over 30 Minutes Intravenous Every 6 hours 04/28/18 1157 04/29/18 0240    04/28/18 0600   ceFAZolin (ANCEF) IVPB 2g/100 mL premix     2 g 200 mL/hr over 30 Minutes Intravenous On call to O.R. 04/27/18 2217 04/28/18 1941     .   POD/HD#: 1   48 y/o male s/p fall off ladder with closed R pilon fracture and closed R tibial shaft fracture    - fall off ladder   - closed comminuted R pilon and tibial shaft fractures s/p external fixation              NWB R leg             Aggressive ice and elevation of R extremity              Reviewed pin care and dressing changes with pt             Compression to R foot and ankle as well             Ok to move toes and knee               Follow up in 1 week with ortho for skin check              Definitive fixation pending swelling improvement                Ok to shower with fixator and to clean pinsites with soap and  water               See dc instructions for very detailed wound and pinsite care    - Pain management:             norco              Robaxin   - ABL anemia/Hemodynamics             Stable   - Medical issues              Nicotine use                         Discussed negative effects of nicotine on bone and wound healing. It can also contribute to prolonged swelling    - DVT/PE prophylaxis:             Lovenox x 4 weeks  - ID:              periop abx completed    - Metabolic Bone Disease:             Vitamin d labs ordered, have  not been drawn for some reason             Put in stat order before discharge   - Activity:             Activity as tolerated while maintaining NWB R leg    - FEN/GI prophylaxis/Foley/Lines:             Reg diet             Dc IVF and IV   -Ex-fix/Splint care:             Ok to manipulate extremity by fixator             Ok to shower with soap and water, remove all dressings before showering    - Impediments to fracture healing:             High energy articular injury             Significant soft tissue injury              Nicotine use    - Dispo:             Dc home today              Follow up with ortho in 1 week      Disposition:    Discharge Instructions    Call MD / Call 911   Complete by:  As directed    If you experience chest pain or shortness of breath, CALL 911 and be transported to the hospital emergency room.  If you develope a fever above 101 F, pus (white drainage) or increased drainage or redness at the wound, or calf pain, call your surgeon's office.   Constipation Prevention   Complete by:  As directed    Drink plenty of fluids.  Prune juice may be helpful.  You may use a stool softener, such as Colace (over the counter) 100 mg twice a day.  Use MiraLax (over the counter) for constipation as needed.   Diet general   Complete by:  As directed    Discharge instructions   Complete by:  As directed    Orthopaedic  Trauma Service Discharge Instructions   General Discharge Instructions  WEIGHT BEARING STATUS: Nonweightbearing right leg  RANGE OF MOTION/ACTIVITY: Unrestricted range of motion right toes and right knee.  Activity as tolerated while maintaining weightbearing restrictions.  Ice and elevate extremity above heart as much as possible to help with swelling control  Bone health: Would recommend continuing vitamin D3 5000 IUs daily and vitamin C 1000 mg daily until instructed to stop  Wound Care: See instructions below.  May begin pin care on 04/30/2018.  Okay to periodically take off Ace wrap to allow soft tissue degrees.  This would be best done when leg is elevated  Discharge Pin Site Instructions  Dress pins daily with Kerlix roll starting on POD 2. Wrap the Kerlix so that it tamps the skin down around the pin-skin interface to prevent/limit motion of the skin relative to the pin.  (Pin-skin motion is the primary cause of pain and infection related to external fixator pin sites).  Remove any crust or coagulum that may obstruct drainage with a saline moistened gauze or soap and water.  After POD 3, if there is no discernable drainage on the pin site dressing, the interval for change can by increased to every other day.  You may shower with the fixator, cleaning  all pin sites gently with soap and water.  If you have a surgical wound this needs to be completely dry and without drainage before showering.  The extremity can be lifted by the fixator to facilitate wound care and transfers.  Notify the office/Doctor if you experience increasing drainage, redness, or pain from a pin site, or if you notice purulent (thick, snot-like) drainage.  Discharge Wound Care Instructions  Do NOT apply any ointments, solutions or lotions to pin sites or surgical wounds.  These prevent needed drainage and even though solutions like hydrogen peroxide kill bacteria, they also damage cells lining the pin sites that  help fight infection.  Applying lotions or ointments can keep the wounds moist and can cause them to breakdown and open up as well. This can increase the risk for infection. When in doubt call the office.  Surgical incisions should be dressed daily.  If any drainage is noted, use one layer of adaptic, then gauze, Kerlix, and an ace wrap.  Once the incision is completely dry and without drainage, it may be left open to air out.  Showering may begin 36-48 hours later.  Cleaning gently with soap and water.  Traumatic wounds should be dressed daily as well.    One layer of adaptic, gauze, Kerlix, then ace wrap.  The adaptic can be discontinued once the draining has ceased    If you have a wet to dry dressing: wet the gauze with saline the squeeze as much saline out so the gauze is moist (not soaking wet), place moistened gauze over wound, then place a dry gauze over the moist one, followed by Kerlix wrap, then ace wrap.  DVT/PE prophylaxis: Lovenox 40 mg injection daily x 30 days.  This is to help prevent the development of blood clots  Diet: as you were eating previously.  Can use over the counter stool softeners and bowel preparations, such as Miralax, to help with bowel movements.  Narcotics can be constipating.  Be sure to drink plenty of fluids  PAIN MEDICATION USE AND EXPECTATIONS  You have likely been given narcotic medications to help control your pain.  After a traumatic event that results in an fracture (broken bone) with or without surgery, it is ok to use narcotic pain medications to help control one's pain.  We understand that everyone responds to pain differently and each individual patient will be evaluated on a regular basis for the continued need for narcotic medications. Ideally, narcotic medication use should last no more than 6-8 weeks (coinciding with fracture healing).   As a patient it is your responsibility as well to monitor narcotic medication use and report the amount and  frequency you use these medications when you come to your office visit.   We would also advise that if you are using narcotic medications, you should take a dose prior to therapy to maximize you participation.  IF YOU ARE ON NARCOTIC MEDICATIONS IT IS NOT PERMISSIBLE TO OPERATE A MOTOR VEHICLE (MOTORCYCLE/CAR/TRUCK/MOPED) OR HEAVY MACHINERY DO NOT MIX NARCOTICS WITH OTHER CNS (CENTRAL NERVOUS SYSTEM) DEPRESSANTS SUCH AS ALCOHOL   STOP SMOKING OR USING NICOTINE PRODUCTS!!!!  As discussed nicotine severely impairs your body's ability to heal surgical and traumatic wounds but also impairs bone healing.  Wounds and bone heal by forming microscopic blood vessels (angiogenesis) and nicotine is a vasoconstrictor (essentially, shrinks blood vessels).  Therefore, if vasoconstriction occurs to these microscopic blood vessels they essentially disappear and are unable to deliver necessary nutrients to the  healing tissue.  This is one modifiable factor that you can do to dramatically increase your chances of healing your injury.    (This means no smoking, no nicotine gum, patches, etc)  DO NOT USE NONSTEROIDAL ANTI-INFLAMMATORY DRUGS (NSAID'S)  Using products such as Advil (ibuprofen), Aleve (naproxen), Motrin (ibuprofen) for additional pain control during fracture healing can delay and/or prevent the healing response.  If you would like to take over the counter (OTC) medication, Tylenol (acetaminophen) is ok.  However, some narcotic medications that are given for pain control contain acetaminophen as well. Therefore, you should not exceed more than 4000 mg of tylenol in a day if you do not have liver disease.  Also note that there are may OTC medicines, such as cold medicines and allergy medicines that my contain tylenol as well.  If you have any questions about medications and/or interactions please ask your doctor/PA or your pharmacist.      ICE AND ELEVATE INJURED/OPERATIVE EXTREMITY  Using ice and elevating  the injured extremity above your heart can help with swelling and pain control.  Icing in a pulsatile fashion, such as 20 minutes on and 20 minutes off, can be followed.    Do not place ice directly on skin. Make sure there is a barrier between to skin and the ice pack.    Using frozen items such as frozen peas works well as the conform nicely to the are that needs to be iced.  USE AN ACE WRAP OR TED HOSE FOR SWELLING CONTROL  In addition to icing and elevation, Ace wraps or TED hose are used to help limit and resolve swelling.  It is recommended to use Ace wraps or TED hose until you are informed to stop.    When using Ace Wraps start the wrapping distally (farthest away from the body) and wrap proximally (closer to the body)   Example: If you had surgery on your leg or thing and you do not have a splint on, start the ace wrap at the toes and work your way up to the thigh        If you had surgery on your upper extremity and do not have a splint on, start the ace wrap at your fingers and work your way up to the upper arm  IF YOU ARE IN A SPLINT OR CAST DO NOT Derma   If your splint gets wet for any reason please contact the office immediately. You may shower in your splint or cast as long as you keep it dry.  This can be done by wrapping in a cast cover or garbage back (or similar)  Do Not stick any thing down your splint or cast such as pencils, money, or hangers to try and scratch yourself with.  If you feel itchy take benadryl as prescribed on the bottle for itching  IF YOU ARE IN A CAM BOOT (BLACK BOOT)  You may remove boot periodically. Perform daily dressing changes as noted below.  Wash the liner of the boot regularly and wear a sock when wearing the boot. It is recommended that you sleep in the boot until told otherwise  CALL THE OFFICE WITH ANY QUESTIONS OR CONCERNS: [phone]   Driving restrictions   Complete by:  As directed    No driving   Increase activity  slowly as tolerated   Complete by:  As directed    Non weight bearing   Complete by:  As directed  Laterality:  right   Extremity:  Lower     Allergies as of 04/29/2018   No Known Allergies     Medication List    TAKE these medications   acetaminophen 500 MG tablet Commonly known as:  TYLENOL Take 1 tablet (500 mg total) by mouth every 12 (twelve) hours.   ascorbic acid 1000 MG tablet Commonly known as:  VITAMIN C Take 1 tablet (1,000 mg total) by mouth daily.   docusate sodium 100 MG capsule Commonly known as:  COLACE Take 1 capsule (100 mg total) by mouth 2 (two) times daily.   enoxaparin 40 MG/0.4ML injection Commonly known as:  LOVENOX Inject 0.4 mLs (40 mg total) into the skin daily.   fexofenadine 180 MG tablet Commonly known as:  ALLEGRA Take 180 mg by mouth daily.   gabapentin 300 MG capsule Commonly known as:  NEURONTIN Take 1 capsule (300 mg total) by mouth 2 (two) times daily for 14 days.   HYDROcodone-acetaminophen 7.5-325 MG tablet Commonly known as:  NORCO Take 1-2 tablets by mouth every 6 (six) hours as needed for moderate pain or severe pain.   methocarbamol 750 MG tablet Commonly known as:  ROBAXIN Take 1 tablet (750 mg total) by mouth every 8 (eight) hours as needed for muscle spasms.   multivitamin with minerals tablet Take 1 tablet by mouth daily.   Vitamin D 125 MCG (5000 UT) Caps Take 1 capsule by mouth daily.     ASK your doctor about these medications   enoxaparin Kit Commonly known as:  LOVENOX 1 kit by Does not apply route once for 1 dose. Ask about: Should I take this medication?            Discharge Care Instructions  (From admission, onward)         Start     Ordered   04/29/18 0000  Non weight bearing    Question Answer Comment  Laterality right   Extremity Lower      04/29/18 1451         Follow-up Information    Altamese K[place]ee, MD. Schedule an appointment as soon as possible for a visit on 05/08/2018.     Specialty:  Orthopedic Surgery Contact information: [place] [address] [phone]           Discharge Instructions and Plan:  48 y/o male s/p fall off ladder with closed R pilon fracture and closed R tibial shaft fracture    - fall off ladder   - closed comminuted R pilon and tibial shaft fractures s/p external fixation              NWB R leg             Aggressive ice and elevation of R extremity              Reviewed pin care and dressing changes with pt             Compression to R foot and ankle as well             Ok to move toes and knee               Follow up in 1 week with ortho for skin check              Definitive fixation pending swelling improvement                Ok to shower  with fixator and to clean pinsites with soap and water               See dc instructions for very detailed wound and pinsite care    - Pain management:             norco              Robaxin  - Medical issues              Nicotine use                         Discussed negative effects of nicotine on bone and wound healing. It can also contribute to prolonged swelling    - DVT/PE prophylaxis:             Lovenox x 4 weeks    - Activity:             Activity as tolerated while maintaining NWB R leg    - FEN/GI prophylaxis/Foley/Lines:             Reg diet   -Ex-fix/Splint care:             Ok to manipulate extremity by fixator             Ok to shower with soap and water, remove all dressings before showering    - Impediments to fracture healing:             High energy articular injury             Significant soft tissue injury              Nicotine use    - Dispo:             Dc home today              Follow up with ortho in 1 week      Signed:  Jari Pigg, PA-C [phone] (C) 04/30/2018, 12:51 PM  [facility] [place] [address] [phone] Domingo Sep (F)

## 2018-04-29 NOTE — TOC Initial Note (Signed)
Transition of Care [facility](TOC) - Initial/Assessment Note    Patient Details  Name: [name] [name] MRN: [id number] Date of Birth: 09/23/1970  Transition of Care [facility](TOC) CM/SW Contact:    Durenda GuthrieBrady, Stephane Niemann Naomi, RN Phone Number: 04/29/2018, 2:16 PM  Clinical Narrative:   Patient presented to the hospital following a fall from a 6 foot ladder onto concrete. He suffered a tibi/fibula fx, and a talus fx. He underwnet external fixation and faciotomies on 04/28/2018.Patient will discharge home with family  and will come back to the hospital in 2 weeks for fixation with plates and screws.          Expected Discharge Plan: Home/Self Care Barriers to Discharge: No Barriers Identified   Patient Goals and CMS Choice       Expected Discharge Plan and Services Expected Discharge Plan: Home/Self Care   Discharge Planning Services: CM Consult   Living arrangements for the past 2 months: Single Family Home Expected Discharge Date: 05/03/18               DME Arranged: Dan HumphreysWalker rolling DME Agency: [employer] HH Arranged: NA HH Agency: NA  Prior Living Arrangements/Services Living arrangements for the past 2 months: Single Family Home Lives with:: Spouse Patient language and need for interpreter reviewed:: No Do you feel safe going back to the place where you live?: Yes      Need for Family Participation in Patient Care: No (Comment) Care giver support system in place?: Yes (comment)   Criminal Activity/Legal Involvement Pertinent to Current Situation/Hospitalization: No - Comment as needed  Activities of Daily Living Home Assistive Devices/Equipment: None ADL Screening (condition at time of admission) Patient's cognitive ability adequate to safely complete daily activities?: Yes Is the patient deaf or have difficulty hearing?: No Does the patient have difficulty seeing, even when wearing glasses/contacts?: No Does the patient have difficulty concentrating, remembering, or making decisions?: No Patient  able to express need for assistance with ADLs?: Yes Does the patient have difficulty dressing or bathing?: Yes Independently performs ADLs?: No Communication: Independent Dressing (OT): Needs assistance Is this a change from baseline?: Change from baseline, expected to last >3 days Grooming: Independent Feeding: Independent Bathing: Needs assistance Is this a change from baseline?: Change from baseline, expected to last >3 days Toileting: Needs assistance Is this a change from baseline?: Change from baseline, expected to last >3days In/Out Bed: Needs assistance Is this a change from baseline?: Change from baseline, expected to last >3 days Walks in Home: Needs assistance Is this a change from baseline?: Change from baseline, expected to last >3 days Does the patient have difficulty walking or climbing stairs?: Yes Weakness of Legs: Right Weakness of Arms/Hands: None  Permission Sought/Granted Permission sought to share information with : Case Manager                Emotional Assessment       Orientation: : Oriented to Self, Oriented to Situation, Oriented to Place, Oriented to  Time Alcohol / Substance Use: Not Applicable    Admission diagnosis:  Smoker [F17.200] Pre-op exam [Z01.818] Closed displaced comminuted fracture of shaft of right tibia, initial encounter [S82.251A] Closed nondisplaced fracture of dome of right talus, initial encounter [S92.144A] Closed right pilon fracture [X91.478G][S82.871A] Patient Active Problem List   Diagnosis Date Noted  . Tibial fracture 04/27/2018  . Closed right pilon fracture 04/27/2018   PCP:  Patient, No Pcp Per Pharmacy:   [id number] - , [place] - [address] [place] [address] Phone: [phone] Fax: [phone]     Social Determinants of Health (SDOH) Interventions    Readmission Risk Interventions No flowsheet data found.

## 2018-04-29 NOTE — Evaluation (Addendum)
Occupational Therapy Evaluation Patient Details Name: [name] [name] MRN: [id number] DOB: Aug 10, 1970 Today's Date: 04/29/2018    History of Present Illness 48 yo male s/p fall from a 6 foot ladder onto concrete. He suffered a tibi/fibula fx, and a talus fx. He underwnet external fixation and faciotomies on 04/28/2018. He will come back to the hospital in 2 weeks for fixation with plates and screws. PMH: History reviewed. No pertinent past medical history.    Clinical Impression   Patient is s/p R external fixator ankle surgery resulting in functional limitations due to the deficits listed below (see OT problem list). Pt currently supervision for transfer and will d/c with RW. Pt educated on bathing and dressing. Pt educated on clothing options for home use.  Patient will benefit from skilled OT acutely to increase independence and safety with ADLS to allow discharge home.     Follow Up Recommendations  No OT follow up    Equipment Recommendations  Other (comment)(RW)    Recommendations for Other Services       Precautions / Restrictions Precautions Precautions: None Restrictions Weight Bearing Restrictions: Yes RLE Weight Bearing: Non weight bearing      Mobility Bed Mobility Overal bed mobility: Independent             General bed mobility comments: able to sit up without increased pain and without assistance   Transfers Overall transfer level: Needs assistance Equipment used: Crutches Transfers: Sit to/from Stand Sit to Stand: Supervision         General transfer comment: min cuing for sit to stand transfer. Once shown how to stand with crutches he had no difficulty transfering.     Balance Overall balance assessment: Needs assistance Sitting-balance support: No upper extremity supported Sitting balance-Leahy Scale: Good     Standing balance support: Bilateral upper extremity supported Standing balance-Leahy Scale: Poor                            ADL either performed or assessed with clinical judgement   ADL Overall ADL's : Needs assistance/impaired Eating/Feeding: Independent   Grooming: Wash/dry hands   Upper Body Bathing: Independent   Lower Body Bathing: Moderate assistance   Upper Body Dressing : Independent   Lower Body Dressing: Minimal assistance Lower Body Dressing Details (indicate cue type and reason): educated  Toilet Transfer: Supervision/safety             General ADL Comments: pt requesting to have RW instead of crutches now . pt advised on house setup for snacks and food and use of bag on the RW     Vision Baseline Vision/History: Wears glasses Wears Glasses: At all times       Perception     Praxis      Pertinent Vitals/Pain Pain Assessment: Faces Faces Pain Scale: Hurts a little bit Pain Location: right LE  Pain Descriptors / Indicators: Aching Pain Intervention(s): Repositioned;Premedicated before session;Monitored during session     Hand Dominance Right   Extremity/Trunk Assessment Upper Extremity Assessment Upper Extremity Assessment: Overall WFL for tasks assessed   Lower Extremity Assessment Lower Extremity Assessment: Defer to PT evaluation RLE: Unable to fully assess due to pain;Unable to fully assess due to immobilization   Cervical / Trunk Assessment Cervical / Trunk Assessment: Normal   Communication Communication Communication: No difficulties   Cognition Arousal/Alertness: Awake/alert Behavior During Therapy: WFL for tasks assessed/performed Overall Cognitive Status: Within Functional Limits for tasks assessed  General Comments  educated on bathing options and need to keep R LE dry at all times. pt will sponge bath at this time.     Exercises     Shoulder Instructions      Home Living Family/patient expects to be discharged to:: Private residence Living Arrangements: Spouse/significant other Available  Help at Discharge: Family Type of Home: House Home Access: Stairs to enter Secretary/administratorntrance Stairs-Number of Steps: 2 Entrance Stairs-Rails: Can reach both Home Layout: Two level     Bathroom Shower/Tub: Tub only;Walk-in shower(garden tub)   Bathroom Toilet: Standard         Additional Comments: Patients bedroom is on the first level. Has his wife and 4 teenage children to help him at home.       Prior Functioning/Environment Level of Independence: Independent                 OT Problem List: Decreased strength;Decreased activity tolerance;Impaired balance (sitting and/or standing);Decreased safety awareness;Decreased knowledge of use of DME or AE;Decreased knowledge of precautions;Pain      OT Treatment/Interventions: Self-care/ADL training;Therapeutic exercise;Neuromuscular education;DME and/or AE instruction;Manual therapy;Therapeutic activities;Balance training;Patient/family education    OT Goals(Current goals can be found in the care plan section) Acute Rehab OT Goals Patient Stated Goal: to go home  OT Goal Formulation: With patient Time For Goal Achievement: 05/13/18 Potential to Achieve Goals: Good  OT Frequency: Min 3X/week   Barriers to D/C:            Co-evaluation              AM-PAC OT "6 Clicks" Daily Activity     Outcome Measure Help from another person eating meals?: None Help from another person taking care of personal grooming?: None Help from another person toileting, which includes using toliet, bedpan, or urinal?: None Help from another person bathing (including washing, rinsing, drying)?: A Little Help from another person to put on and taking off regular upper body clothing?: None Help from another person to put on and taking off regular lower body clothing?: A Little 6 Click Score: 22   End of Session Nurse Communication: Mobility status;Precautions;Weight bearing status  Activity Tolerance: Patient tolerated treatment well Patient  left: in bed;with call bell/phone within reach  OT Visit Diagnosis: Unsteadiness on feet (R26.81);Muscle weakness (generalized) (M62.81);Pain Pain - Right/Left: Right Pain - part of body: Leg                Time: [id number] OT Time Calculation (min): 28 min Charges:  OT General Charges $OT Visit: 1 Visit OT Evaluation $OT Eval Moderate Complexity: 1 Mod   Mateo FlowBrynn Jessicah Croll, OTR/L  Acute Rehabilitation Services Pager: [phone] Office: [phone] .   Mateo FlowBrynn Avier Jech 04/29/2018, 11:49 AM

## 2018-04-29 NOTE — Progress Notes (Signed)
Peripheral IV removed from RA. Pt tolerated well. Oral and written discharge instructions given. Script for lovenox and teaching given. Rolling walker in pt possession. Awaiting wife for transport home.

## 2018-04-30 ENCOUNTER — Encounter (HOSPITAL_COMMUNITY): Payer: Self-pay | Admitting: Orthopedic Surgery

## 2018-04-30 DIAGNOSIS — S92109A Unspecified fracture of unspecified talus, initial encounter for closed fracture: Secondary | ICD-10-CM

## 2018-04-30 HISTORY — DX: Unspecified fracture of unspecified talus, initial encounter for closed fracture: S92.109A

## 2018-04-30 LAB — VITAMIN D 25 HYDROXY (VIT D DEFICIENCY, FRACTURES): Vit D, 25-Hydroxy: 24.4 ng/mL — ABNORMAL LOW (ref 30.0–100.0)

## 2018-04-30 LAB — CALCITRIOL (1,25 DI-OH VIT D): Vit D, 1,25-Dihydroxy: 89.7 pg/mL — ABNORMAL HIGH (ref 19.9–79.3)

## 2018-05-10 NOTE — Op Note (Signed)
04/28/2018  1:10 PM  NAME: [name] [name] MEDICAL RECORD ZO:[id number] ACCOUNT NO.: DATE OF BIRTH:12/03/1970 FACILITY: [facility] PHYSICIAN:Ivann Trimarco H. Keyleigh Manninen, MD  OPERATIVE REPORT  DATE OF PROCEDURE:  @DATE @  PREOPERATIVE DIAGNOSES:   1.  Right ankle pilon fracture, tibia and fibula. 2.  Right talar body fracture. 3.  Right ankle syndesmotic disruption. 4. Possible compartment syndrome.  POSTOPERATIVE DIAGNOSES: 1.  Right ankle pilon fracture, tibia and fibula. 2.  Right talar body fracture. 3.  Right ankle syndesmotic disruption. 4. Possible compartment syndrome.  PROCEDURES: 1.  Closed reduction of right pilon, tibia and fibula. 2.  Closed reduction of right talus. 3.  Application of uniplanar spanning external fixator right ankle. 4.  Application of uniplanar spanning external fixator right foot.  SURGEON:  Myrene GalasMichael Jamilynn Whitacre, MD  ASSISTANT:  Montez MoritaKeith Paul, PA-C.  ANESTHESIA:  General.  COMPLICATIONS:  None.  TOURNIQUET:  None.  DRAINS:  None.  SPECIMENS:  None.  DISPOSITION:  To PACU.  CONDITION:  Stable.  INDICATIONS FOR PROCEDURE:  The patient is a 48 year old male who fell about 7 feet onto concrete sustaining immediate pain and swelling of the right ankle with inability to bear weight.  He had marked displacement of both pilon and talus intra-articular fractures. As a fellowship-trained orthopedic traumatologist and the surgeon on call, I recommended a spanning external  fixation with possible compartment releases. We discussed a skin breakdown that could result in deep infection could be limb threatening nerve injury, vessel injury, DVT, PE,  arthritis, loss of motion, pin tract infection, anesthetic complications and multiple others and he would require a staged definitive internal fixation.  The patient acknowledged these risks and strongly wished to proceed.  SUMMARY OF PROCEDURE:  The patient was given preoperative antibiotics, taken to the operating room where general  anesthesia was induced.  His right lower extremity was prepped and draped in the usual sterile fashion beginning with chlorhexidine wash,  Betadine scrub and paint.  Soft tissue swelling was considerable.  There were some fracture blisters beginning to form along the medial aspect of the leg.  A C-arm was brought in after making 2 small stab incisions and placing the tibial Schanz pins in the shaft.  These were checked on AP and lateral images for position and length.  The clamp was then secured here.  C-arm was brought distally where a lateral was performed of the calcaneus and a transcalcaneal pin placed through a small stab incision from medial to lateral parallel with the talar dome.  With the help of my assistant, I placed a bump underneath the fracture site and pulled longitudinal traction, derotated and translated the talus directly underneath the tibia to align the tibial pilon and fibula fractures. While I held the reduction, my assistant secured the clamps and bars.  An assistant was necessary for this.  We then placed 2 pins in the first and fifth metatarsal.  I brought the foot up into a plantigrade position to reduce the talus fracture and then constructed another uniplanar ex fix uniplanar construct and held it there while my assistant again secured the bars and pins.  Final images were obtained consisting of AP, mortise and lateral projections that showed restoration of length and alignment with again acceptable pin placement.  Also demonstrated disruption of the syndesmosis with excessive widening there.  Mepitel was placed over the fracture blisters then gauze and a gently compressive dressing from foot to knee. The soft tissue condition improved with application of the fixator to the point where  we felt compartment release was not necessary at this time and could be followed with serial examinations post op. The patient was awakened from anesthesia and transported to PACU in stable condition.    PROGNOSIS:  He will be on Lovenox for DVT prophylaxis.  I will continue to follow him postoperatively to evaluate his compartment swelling. We would anticipate discharge tomorrow or the next day and definitive fixation not less than 2 weeks given the degree of swelling at this point.  He will maintain nonweightbearing order with PT and OT. He has been counseled regarding smoking cessation.  Myrene Galas, MD [facility], [facility] [phone]

## 2018-05-17 ENCOUNTER — Other Ambulatory Visit: Payer: Self-pay

## 2018-05-17 ENCOUNTER — Other Ambulatory Visit (HOSPITAL_COMMUNITY)
Admission: RE | Admit: 2018-05-17 | Discharge: 2018-05-17 | Disposition: A | Discharge status: Home / Self Care | Payer: BLUE CROSS/BLUE SHIELD | Source: Ambulatory Visit | Attending: Orthopedic Surgery | Admitting: Orthopedic Surgery

## 2018-05-17 DIAGNOSIS — Z1159 Encounter for screening for other viral diseases: Secondary | ICD-10-CM | POA: Diagnosis not present

## 2018-05-18 LAB — NOVEL CORONAVIRUS, NAA (HOSP ORDER, SEND-OUT TO REF LAB; TAT 18-24 HRS): SARS-CoV-2, NAA: NOT DETECTED

## 2018-05-20 ENCOUNTER — Encounter (HOSPITAL_COMMUNITY): Payer: Self-pay | Admitting: *Deleted

## 2018-05-20 ENCOUNTER — Other Ambulatory Visit: Payer: Self-pay

## 2018-05-20 NOTE — Progress Notes (Signed)
[name] [name] denies chest pain nor shortness of breath.  [name] [name] reports that he has been quarantined since Friday, May 8 testing. Patient denies that he nor his  family has experienced any of the following: Cough Fever >100.4 Runny Nose Sore Throat Difficulty breathing/ shortness of breath Travel in past 14 days- none

## 2018-05-20 NOTE — Anesthesia Preprocedure Evaluation (Addendum)
Anesthesia Evaluation  Patient identified by MRN, date of birth, ID band Patient awake    Reviewed: Allergy & Precautions, NPO status , Patient's Chart, lab work & pertinent test results  History of Anesthesia Complications Negative for: history of anesthetic complications  Airway Mallampati: I  TM Distance: >3 FB Neck ROM: Full    Dental  (+) Dental Advisory Given   Pulmonary former smoker (recently quit),    breath sounds clear to auscultation       Cardiovascular negative cardio ROS   Rhythm:Regular Rate:Normal     Neuro/Psych negative neurological ROS     GI/Hepatic negative GI ROS, Neg liver ROS,   Endo/Other  negative endocrine ROS  Renal/GU negative Renal ROS     Musculoskeletal   Abdominal   Peds  Hematology negative hematology ROS (+)   Anesthesia Other Findings   Reproductive/Obstetrics                            Anesthesia Physical Anesthesia Plan  ASA: II  Anesthesia Plan: General   Post-op Pain Management: GA combined w/ Regional for post-op pain   Induction: Intravenous  PONV Risk Score and Plan: 2 and Ondansetron and Dexamethasone  Airway Management Planned: Oral ETT  Additional Equipment:   Intra-op Plan:   Post-operative Plan: Extubation in OR  Informed Consent: I have reviewed the patients History and Physical, chart, labs and discussed the procedure including the risks, benefits and alternatives for the proposed anesthesia with the patient or authorized representative who has indicated his/her understanding and acceptance.     Dental advisory given  Plan Discussed with: CRNA and Surgeon  Anesthesia Plan Comments: (Plan routine monitors, GETA with adductor and popliteal blocks for post op analgesia)       Anesthesia Quick Evaluation

## 2018-05-21 ENCOUNTER — Inpatient Hospital Stay (HOSPITAL_COMMUNITY): Payer: BLUE CROSS/BLUE SHIELD

## 2018-05-21 ENCOUNTER — Inpatient Hospital Stay (HOSPITAL_COMMUNITY): Payer: BLUE CROSS/BLUE SHIELD | Admitting: Anesthesiology

## 2018-05-21 ENCOUNTER — Inpatient Hospital Stay (HOSPITAL_COMMUNITY)
Admission: RE | Admit: 2018-05-21 | Discharge: 2018-05-22 | DRG: 493 | Disposition: A | Discharge status: Home / Self Care | Payer: BLUE CROSS/BLUE SHIELD | Attending: Orthopedic Surgery | Admitting: Orthopedic Surgery

## 2018-05-21 ENCOUNTER — Other Ambulatory Visit: Payer: Self-pay

## 2018-05-21 ENCOUNTER — Encounter (HOSPITAL_COMMUNITY)
Admission: RE | Disposition: A | Discharge status: Home / Self Care | Payer: Self-pay | Source: Home / Self Care | Attending: Orthopedic Surgery

## 2018-05-21 ENCOUNTER — Encounter (HOSPITAL_COMMUNITY): Payer: Self-pay

## 2018-05-21 DIAGNOSIS — W11XXXA Fall on and from ladder, initial encounter: Secondary | ICD-10-CM | POA: Diagnosis present

## 2018-05-21 DIAGNOSIS — E559 Vitamin D deficiency, unspecified: Secondary | ICD-10-CM

## 2018-05-21 DIAGNOSIS — S82401A Unspecified fracture of shaft of right fibula, initial encounter for closed fracture: Secondary | ICD-10-CM | POA: Diagnosis present

## 2018-05-21 DIAGNOSIS — S82871A Displaced pilon fracture of right tibia, initial encounter for closed fracture: Secondary | ICD-10-CM | POA: Diagnosis present

## 2018-05-21 DIAGNOSIS — Z720 Tobacco use: Secondary | ICD-10-CM | POA: Diagnosis present

## 2018-05-21 DIAGNOSIS — D62 Acute posthemorrhagic anemia: Secondary | ICD-10-CM | POA: Diagnosis not present

## 2018-05-21 DIAGNOSIS — T148XXA Other injury of unspecified body region, initial encounter: Secondary | ICD-10-CM

## 2018-05-21 DIAGNOSIS — S82209A Unspecified fracture of shaft of unspecified tibia, initial encounter for closed fracture: Secondary | ICD-10-CM | POA: Diagnosis present

## 2018-05-21 DIAGNOSIS — E8889 Other specified metabolic disorders: Secondary | ICD-10-CM | POA: Diagnosis present

## 2018-05-21 DIAGNOSIS — S92109A Unspecified fracture of unspecified talus, initial encounter for closed fracture: Secondary | ICD-10-CM | POA: Diagnosis present

## 2018-05-21 DIAGNOSIS — S82201A Unspecified fracture of shaft of right tibia, initial encounter for closed fracture: Secondary | ICD-10-CM | POA: Diagnosis present

## 2018-05-21 DIAGNOSIS — Z419 Encounter for procedure for purposes other than remedying health state, unspecified: Secondary | ICD-10-CM

## 2018-05-21 DIAGNOSIS — S92101A Unspecified fracture of right talus, initial encounter for closed fracture: Secondary | ICD-10-CM | POA: Diagnosis present

## 2018-05-21 DIAGNOSIS — F17200 Nicotine dependence, unspecified, uncomplicated: Secondary | ICD-10-CM | POA: Diagnosis present

## 2018-05-21 HISTORY — PX: ORIF TIBIA FRACTURE: SHX5416

## 2018-05-21 HISTORY — DX: Allergy, unspecified, initial encounter: T78.40XA

## 2018-05-21 LAB — COMPREHENSIVE METABOLIC PANEL
ALT: 25 U/L (ref 0–44)
AST: 19 U/L (ref 15–41)
Albumin: 3.9 g/dL (ref 3.5–5.0)
Alkaline Phosphatase: 68 U/L (ref 38–126)
Anion gap: 11 (ref 5–15)
BUN: 9 mg/dL (ref 6–20)
CO2: 26 mmol/L (ref 22–32)
Calcium: 9.7 mg/dL (ref 8.9–10.3)
Chloride: 101 mmol/L (ref 98–111)
Creatinine, Ser: 0.92 mg/dL (ref 0.61–1.24)
GFR calc Af Amer: >60 mL/min (ref >60)
GFR calc non Af Amer: >60 mL/min (ref >60)
Glucose, Bld: 106 mg/dL — ABNORMAL HIGH (ref 70–99)
Potassium: 4.3 mmol/L (ref 3.5–5.1)
Sodium: 138 mmol/L (ref 135–145)
Total Bilirubin: 0.9 mg/dL (ref 0.3–1.2)
Total Protein: 6.8 g/dL (ref 6.5–8.1)

## 2018-05-21 LAB — CBC WITH DIFFERENTIAL/PLATELET
Abs Immature Granulocytes: 0.04 10*3/uL (ref 0.00–0.07)
Basophils Absolute: 0.1 10*3/uL (ref 0.0–0.1)
Basophils Relative: 1 %
Eosinophils Absolute: 0.2 10*3/uL (ref 0.0–0.5)
Eosinophils Relative: 2 %
HCT: 39.8 % (ref 39.0–52.0)
Hemoglobin: 13.3 g/dL (ref 13.0–17.0)
Immature Granulocytes: 1 %
Lymphocytes Relative: 20 %
Lymphs Abs: 1.8 10*3/uL (ref 0.7–4.0)
MCH: 32.2 pg (ref 26.0–34.0)
MCHC: 33.4 g/dL (ref 30.0–36.0)
MCV: 96.4 fL (ref 80.0–100.0)
Monocytes Absolute: 0.8 10*3/uL (ref 0.1–1.0)
Monocytes Relative: 9 %
Neutro Abs: 6 10*3/uL (ref 1.7–7.7)
Neutrophils Relative %: 67 %
Platelets: 418 10*3/uL — ABNORMAL HIGH (ref 150–400)
RBC: 4.13 MIL/uL — ABNORMAL LOW (ref 4.22–5.81)
RDW: 12.3 % (ref 11.5–15.5)
WBC: 8.8 10*3/uL (ref 4.0–10.5)
nRBC: 0 % (ref 0.0–0.2)

## 2018-05-21 LAB — CBC
HCT: 35.5 % — ABNORMAL LOW (ref 39.0–52.0)
Hemoglobin: 12.3 g/dL — ABNORMAL LOW (ref 13.0–17.0)
MCH: 32.7 pg (ref 26.0–34.0)
MCHC: 34.6 g/dL (ref 30.0–36.0)
MCV: 94.4 fL (ref 80.0–100.0)
Platelets: 370 10*3/uL (ref 150–400)
RBC: 3.76 MIL/uL — ABNORMAL LOW (ref 4.22–5.81)
RDW: 12.4 % (ref 11.5–15.5)
WBC: 10 10*3/uL (ref 4.0–10.5)
nRBC: 0 % (ref 0.0–0.2)

## 2018-05-21 LAB — PROTIME-INR
INR: 1 (ref 0.8–1.2)
Prothrombin Time: 13.1 seconds (ref 11.4–15.2)

## 2018-05-21 LAB — CREATININE, SERUM
Creatinine, Ser: 0.73 mg/dL (ref 0.61–1.24)
GFR calc Af Amer: >60 mL/min (ref >60)
GFR calc non Af Amer: >60 mL/min (ref >60)

## 2018-05-21 LAB — ABO/RH: ABO/RH(D): A POS

## 2018-05-21 LAB — TYPE AND SCREEN
ABO/RH(D): A POS
Antibody Screen: NEGATIVE

## 2018-05-21 LAB — PREALBUMIN: Prealbumin: 27 mg/dL (ref 18–38)

## 2018-05-21 LAB — SURGICAL PCR SCREEN
MRSA, PCR: NEGATIVE
Staphylococcus aureus: NEGATIVE

## 2018-05-21 LAB — APTT: aPTT: 33 seconds (ref 24–36)

## 2018-05-21 LAB — TSH: TSH: 4.876 u[IU]/mL — ABNORMAL HIGH (ref 0.350–4.500)

## 2018-05-21 SURGERY — OPEN REDUCTION INTERNAL FIXATION (ORIF) TIBIA FRACTURE
Anesthesia: General | Site: Leg Lower | Laterality: Right

## 2018-05-21 MED ORDER — ROCURONIUM BROMIDE 50 MG/5ML IV SOSY
PREFILLED_SYRINGE | INTRAVENOUS | Status: DC | PRN
  Administered 2018-05-21: 20 mg via INTRAVENOUS
  Administered 2018-05-21: 10 mg via INTRAVENOUS
  Administered 2018-05-21: 50 mg via INTRAVENOUS
  Administered 2018-05-21: 20 mg via INTRAVENOUS

## 2018-05-21 MED ORDER — 0.9 % SODIUM CHLORIDE (POUR BTL) OPTIME
TOPICAL | Status: DC | PRN
  Administered 2018-05-21: 09:00:00 1000 mL

## 2018-05-21 MED ORDER — FENTANYL CITRATE (PF) 100 MCG/2ML IJ SOLN
100.0000 ug | Freq: Once | INTRAMUSCULAR | Status: AC
  Administered 2018-05-21: 100 ug via INTRAVENOUS

## 2018-05-21 MED ORDER — ADULT MULTIVITAMIN W/MINERALS CH
1.0000 | ORAL_TABLET | Freq: Every day | ORAL | Status: DC
  Administered 2018-05-21 – 2018-05-22 (×2): 1 via ORAL
  Filled 2018-05-21 (×4): qty 1

## 2018-05-21 MED ORDER — ENOXAPARIN SODIUM 40 MG/0.4ML ~~LOC~~ SOLN
40.0000 mg | SUBCUTANEOUS | Status: DC
  Administered 2018-05-22: 10:00:00 40 mg via SUBCUTANEOUS
  Filled 2018-05-21: qty 0.4

## 2018-05-21 MED ORDER — FENTANYL CITRATE (PF) 250 MCG/5ML IJ SOLN
INTRAMUSCULAR | Status: AC
  Filled 2018-05-21: qty 5

## 2018-05-21 MED ORDER — MIDAZOLAM HCL 5 MG/5ML IJ SOLN
INTRAMUSCULAR | Status: DC | PRN
  Administered 2018-05-21: 1 mg via INTRAVENOUS
  Administered 2018-05-21 (×2): 0.5 mg via INTRAVENOUS

## 2018-05-21 MED ORDER — PHENYLEPHRINE HCL (PRESSORS) 10 MG/ML IV SOLN
INTRAVENOUS | Status: DC | PRN
  Administered 2018-05-21 (×2): 40 ug via INTRAVENOUS
  Administered 2018-05-21 (×5): 80 ug via INTRAVENOUS

## 2018-05-21 MED ORDER — ONDANSETRON HCL 4 MG/2ML IJ SOLN: 4.0000 mg | Freq: Four times a day (QID) | INTRAMUSCULAR | Status: DC | PRN

## 2018-05-21 MED ORDER — CELECOXIB 200 MG PO CAPS
400.0000 mg | ORAL_CAPSULE | Freq: Once | ORAL | Status: AC
  Administered 2018-05-21: 400 mg via ORAL
  Filled 2018-05-21: qty 2

## 2018-05-21 MED ORDER — VITAMIN D 125 MCG (5000 UT) PO CAPS: 1.0000 | ORAL_CAPSULE | Freq: Every day | ORAL | Status: DC

## 2018-05-21 MED ORDER — BUPIVACAINE-EPINEPHRINE (PF) 0.5% -1:200000 IJ SOLN
INTRAMUSCULAR | Status: DC | PRN
  Administered 2018-05-21: 10 mL via PERINEURAL
  Administered 2018-05-21: 30 mL via PERINEURAL

## 2018-05-21 MED ORDER — MIDAZOLAM HCL 2 MG/2ML IJ SOLN: 0.5000 mg | Freq: Once | INTRAMUSCULAR | Status: DC | PRN

## 2018-05-21 MED ORDER — ACETAMINOPHEN 500 MG PO TABS
1000.0000 mg | ORAL_TABLET | Freq: Once | ORAL | Status: AC
  Administered 2018-05-21: 1000 mg via ORAL
  Filled 2018-05-21: qty 2

## 2018-05-21 MED ORDER — LACTATED RINGERS IV SOLN
INTRAVENOUS | Status: DC
  Administered 2018-05-21 (×3): via INTRAVENOUS

## 2018-05-21 MED ORDER — PROMETHAZINE HCL 25 MG/ML IJ SOLN: 6.2500 mg | INTRAMUSCULAR | Status: DC | PRN

## 2018-05-21 MED ORDER — FENTANYL CITRATE (PF) 100 MCG/2ML IJ SOLN
INTRAMUSCULAR | Status: DC | PRN
  Administered 2018-05-21 (×5): 50 ug via INTRAVENOUS

## 2018-05-21 MED ORDER — VITAMIN C 500 MG PO TABS
1000.0000 mg | ORAL_TABLET | Freq: Every day | ORAL | Status: DC
  Administered 2018-05-21 – 2018-05-22 (×2): 1000 mg via ORAL
  Filled 2018-05-21 (×2): qty 2

## 2018-05-21 MED ORDER — ONDANSETRON HCL 4 MG PO TABS: 4.0000 mg | ORAL_TABLET | Freq: Four times a day (QID) | ORAL | Status: DC | PRN

## 2018-05-21 MED ORDER — LIDOCAINE 2% (20 MG/ML) 5 ML SYRINGE
INTRAMUSCULAR | Status: AC
  Filled 2018-05-21: qty 5

## 2018-05-21 MED ORDER — MORPHINE SULFATE (PF) 2 MG/ML IV SOLN: 0.5000 mg | INTRAVENOUS | Status: DC | PRN

## 2018-05-21 MED ORDER — DOCUSATE SODIUM 100 MG PO CAPS
100.0000 mg | ORAL_CAPSULE | Freq: Two times a day (BID) | ORAL | Status: DC
  Administered 2018-05-21 – 2018-05-22 (×2): 100 mg via ORAL
  Filled 2018-05-21 (×2): qty 1

## 2018-05-21 MED ORDER — CEFAZOLIN SODIUM-DEXTROSE 2-4 GM/100ML-% IV SOLN
2.0000 g | INTRAVENOUS | Status: AC
  Administered 2018-05-21 (×2): 2 g via INTRAVENOUS
  Filled 2018-05-21: qty 100

## 2018-05-21 MED ORDER — DEXAMETHASONE SODIUM PHOSPHATE 10 MG/ML IJ SOLN
INTRAMUSCULAR | Status: DC | PRN
  Administered 2018-05-21: 10 mg via INTRAVENOUS

## 2018-05-21 MED ORDER — ACETAMINOPHEN 500 MG PO TABS
500.0000 mg | ORAL_TABLET | Freq: Two times a day (BID) | ORAL | Status: DC
  Administered 2018-05-21: 500 mg via ORAL
  Filled 2018-05-21: qty 1

## 2018-05-21 MED ORDER — GABAPENTIN 300 MG PO CAPS
300.0000 mg | ORAL_CAPSULE | Freq: Two times a day (BID) | ORAL | Status: DC
  Administered 2018-05-21 – 2018-05-22 (×2): 300 mg via ORAL
  Filled 2018-05-21 (×2): qty 1

## 2018-05-21 MED ORDER — FENTANYL CITRATE (PF) 100 MCG/2ML IJ SOLN
INTRAMUSCULAR | Status: AC
  Administered 2018-05-21: 07:00:00 100 ug via INTRAVENOUS
  Filled 2018-05-21: qty 2

## 2018-05-21 MED ORDER — MIDAZOLAM HCL 2 MG/2ML IJ SOLN
INTRAMUSCULAR | Status: AC
  Administered 2018-05-21: 07:00:00 2 mg via INTRAVENOUS
  Filled 2018-05-21: qty 2

## 2018-05-21 MED ORDER — SUGAMMADEX SODIUM 200 MG/2ML IV SOLN
INTRAVENOUS | Status: DC | PRN
  Administered 2018-05-21: 150 mg via INTRAVENOUS

## 2018-05-21 MED ORDER — LORATADINE 10 MG PO TABS
10.0000 mg | ORAL_TABLET | Freq: Every day | ORAL | Status: DC
  Administered 2018-05-21 – 2018-05-22 (×2): 10 mg via ORAL
  Filled 2018-05-21 (×2): qty 1

## 2018-05-21 MED ORDER — HYDROMORPHONE HCL 1 MG/ML IJ SOLN: 0.2500 mg | INTRAMUSCULAR | Status: DC | PRN

## 2018-05-21 MED ORDER — LIDOCAINE 2% (20 MG/ML) 5 ML SYRINGE
INTRAMUSCULAR | Status: DC | PRN
  Administered 2018-05-21: 25 mg via INTRAVENOUS
  Administered 2018-05-21: 75 mg via INTRAVENOUS

## 2018-05-21 MED ORDER — POTASSIUM CHLORIDE IN NACL 20-0.9 MEQ/L-% IV SOLN
INTRAVENOUS | Status: DC
  Administered 2018-05-21: 18:00:00 via INTRAVENOUS
  Filled 2018-05-21: qty 1000

## 2018-05-21 MED ORDER — ROCURONIUM BROMIDE 10 MG/ML (PF) SYRINGE
PREFILLED_SYRINGE | INTRAVENOUS | Status: AC
  Filled 2018-05-21: qty 10

## 2018-05-21 MED ORDER — PROPOFOL 10 MG/ML IV BOLUS
INTRAVENOUS | Status: AC
  Filled 2018-05-21: qty 40

## 2018-05-21 MED ORDER — ONDANSETRON HCL 4 MG/2ML IJ SOLN
INTRAMUSCULAR | Status: DC | PRN
  Administered 2018-05-21: 4 mg via INTRAVENOUS

## 2018-05-21 MED ORDER — HYDROCODONE-ACETAMINOPHEN 7.5-325 MG PO TABS
1.0000 | ORAL_TABLET | Freq: Four times a day (QID) | ORAL | Status: DC | PRN
  Administered 2018-05-21 – 2018-05-22 (×4): 2 via ORAL
  Filled 2018-05-21 (×2): qty 2
  Filled 2018-05-21: qty 1
  Filled 2018-05-21: qty 2
  Filled 2018-05-21: qty 1

## 2018-05-21 MED ORDER — MIDAZOLAM HCL 2 MG/2ML IJ SOLN
2.0000 mg | Freq: Once | INTRAMUSCULAR | Status: AC
  Administered 2018-05-21: 07:00:00 2 mg via INTRAVENOUS

## 2018-05-21 MED ORDER — METHOCARBAMOL 750 MG PO TABS
750.0000 mg | ORAL_TABLET | Freq: Three times a day (TID) | ORAL | Status: DC | PRN
  Administered 2018-05-22: 06:00:00 750 mg via ORAL
  Filled 2018-05-21: qty 1

## 2018-05-21 MED ORDER — CEFAZOLIN SODIUM-DEXTROSE 1-4 GM/50ML-% IV SOLN
1.0000 g | Freq: Four times a day (QID) | INTRAVENOUS | Status: AC
  Administered 2018-05-21 – 2018-05-22 (×3): 1 g via INTRAVENOUS
  Filled 2018-05-21 (×3): qty 50

## 2018-05-21 MED ORDER — VITAMIN D 25 MCG (1000 UNIT) PO TABS
5000.0000 [IU] | ORAL_TABLET | Freq: Every day | ORAL | Status: DC
  Administered 2018-05-21 – 2018-05-22 (×2): 5000 [IU] via ORAL
  Filled 2018-05-21 (×2): qty 5

## 2018-05-21 MED ORDER — MIDAZOLAM HCL 2 MG/2ML IJ SOLN
INTRAMUSCULAR | Status: AC
  Filled 2018-05-21: qty 2

## 2018-05-21 MED ORDER — PANTOPRAZOLE SODIUM 40 MG PO TBEC
40.0000 mg | DELAYED_RELEASE_TABLET | Freq: Every day | ORAL | Status: DC
  Administered 2018-05-21 – 2018-05-22 (×2): 40 mg via ORAL
  Filled 2018-05-21 (×2): qty 1

## 2018-05-21 MED ORDER — DEXAMETHASONE SODIUM PHOSPHATE 10 MG/ML IJ SOLN
INTRAMUSCULAR | Status: AC
  Filled 2018-05-21: qty 1

## 2018-05-21 MED ORDER — ONDANSETRON HCL 4 MG/2ML IJ SOLN
INTRAMUSCULAR | Status: AC
  Filled 2018-05-21: qty 2

## 2018-05-21 MED ORDER — MEPERIDINE HCL 25 MG/ML IJ SOLN: 6.2500 mg | INTRAMUSCULAR | Status: DC | PRN

## 2018-05-21 MED ORDER — METOCLOPRAMIDE HCL 5 MG PO TABS: 5.0000 mg | ORAL_TABLET | Freq: Three times a day (TID) | ORAL | Status: DC | PRN

## 2018-05-21 MED ORDER — METOCLOPRAMIDE HCL 5 MG/ML IJ SOLN: 5.0000 mg | Freq: Three times a day (TID) | INTRAMUSCULAR | Status: DC | PRN

## 2018-05-21 MED ORDER — EPHEDRINE SULFATE 50 MG/ML IJ SOLN: INTRAMUSCULAR | Status: DC | PRN

## 2018-05-21 MED ORDER — PROPOFOL 10 MG/ML IV BOLUS
INTRAVENOUS | Status: DC | PRN
  Administered 2018-05-21: 150 mg via INTRAVENOUS

## 2018-05-21 SURGICAL SUPPLY — 108 items
BANDAGE ACE 3X5.8 VEL STRL LF (GAUZE/BANDAGES/DRESSINGS) ×3 IMPLANT
BANDAGE ACE 4X5 VEL STRL LF (GAUZE/BANDAGES/DRESSINGS) ×3 IMPLANT
BANDAGE ACE 6X5 VEL STRL LF (GAUZE/BANDAGES/DRESSINGS) IMPLANT
BANDAGE ESMARK 6X9 LF (GAUZE/BANDAGES/DRESSINGS) IMPLANT
BIT DRILL 2 FAST STEP (BIT) ×3 IMPLANT
BIT DRILL 2.0 (BIT) ×2
BIT DRILL 2.5X4 QC (BIT) ×3 IMPLANT
BIT DRILL 2.7MMX100MM LONG (BIT) ×1
BIT DRILL 2.7X100 LONG (BIT) ×2 IMPLANT
BIT DRILL 2.9 CANN QC NONSTRL (BIT) ×3 IMPLANT
BIT DRILL SHRT 110X2XCANN (BIT) ×1 IMPLANT
BIT DRILL SOLID 2.0 X 110MM (DRILL) ×1 IMPLANT
BIT DRL SHRT 110X2XCANN (BIT) ×1
BLADE CLIPPER SURG (BLADE) IMPLANT
BNDG COHESIVE 4X5 TAN STRL (GAUZE/BANDAGES/DRESSINGS) IMPLANT
BNDG ESMARK 6X9 LF (GAUZE/BANDAGES/DRESSINGS)
BNDG GAUZE ELAST 4 BULKY (GAUZE/BANDAGES/DRESSINGS) ×3 IMPLANT
BRUSH SCRUB SURG 4.25 DISP (MISCELLANEOUS) ×6 IMPLANT
CANISTER WOUNDNEG PRESSURE 500 (CANNISTER) ×3 IMPLANT
COVER MAYO STAND STRL (DRAPES) ×3 IMPLANT
COVER WAND RF STERILE (DRAPES) IMPLANT
DRAPE C-ARM 42X72 X-RAY (DRAPES) ×3 IMPLANT
DRAPE C-ARMOR (DRAPES) ×3 IMPLANT
DRAPE HALF SHEET 40X57 (DRAPES) ×6 IMPLANT
DRAPE INCISE IOBAN 66X45 STRL (DRAPES) ×3 IMPLANT
DRAPE U-SHAPE 47X51 STRL (DRAPES) ×3 IMPLANT
DRESSING PREVENA PLUS CUSTOM (GAUZE/BANDAGES/DRESSINGS) ×1 IMPLANT
DRILL SOLID 2.0 X 110MM (DRILL) ×3
DRSG ADAPTIC 3X8 NADH LF (GAUZE/BANDAGES/DRESSINGS) IMPLANT
DRSG PAD ABDOMINAL 8X10 ST (GAUZE/BANDAGES/DRESSINGS) ×3 IMPLANT
DRSG PREVENA PLUS CUSTOM (GAUZE/BANDAGES/DRESSINGS) ×3
ELECT REM PT RETURN 9FT ADLT (ELECTROSURGICAL) ×3
ELECTRODE REM PT RTRN 9FT ADLT (ELECTROSURGICAL) ×1 IMPLANT
GAUZE SPONGE 4X4 12PLY STRL (GAUZE/BANDAGES/DRESSINGS) ×3 IMPLANT
GLOVE BIO SURGEON STRL SZ7.5 (GLOVE) ×6 IMPLANT
GLOVE BIO SURGEON STRL SZ8 (GLOVE) ×6 IMPLANT
GLOVE BIOGEL PI IND STRL 7.5 (GLOVE) ×2 IMPLANT
GLOVE BIOGEL PI IND STRL 8 (GLOVE) ×2 IMPLANT
GLOVE BIOGEL PI INDICATOR 7.5 (GLOVE) ×4
GLOVE BIOGEL PI INDICATOR 8 (GLOVE) ×4
GLOVE PROGUARD SZ 7 1/2 (GLOVE) ×3 IMPLANT
GOWN STRL REUS W/ TWL LRG LVL3 (GOWN DISPOSABLE) ×2 IMPLANT
GOWN STRL REUS W/ TWL XL LVL3 (GOWN DISPOSABLE) ×1 IMPLANT
GOWN STRL REUS W/TWL LRG LVL3 (GOWN DISPOSABLE) ×4
GOWN STRL REUS W/TWL XL LVL3 (GOWN DISPOSABLE) ×2
K-WIRE ACE 1.6X6 (WIRE) ×9
KIT BASIN OR (CUSTOM PROCEDURE TRAY) ×3 IMPLANT
KIT DRSG PREVENA PLUS 7DAY 125 (MISCELLANEOUS) ×3 IMPLANT
KIT TURNOVER KIT B (KITS) ×3 IMPLANT
KWIRE ACE 1.6X6 (WIRE) ×3 IMPLANT
MANIFOLD NEPTUNE II (INSTRUMENTS) ×3 IMPLANT
NS IRRIG 1000ML POUR BTL (IV SOLUTION) ×3 IMPLANT
PACK ORTHO EXTREMITY (CUSTOM PROCEDURE TRAY) ×3 IMPLANT
PAD ARMBOARD 7.5X6 YLW CONV (MISCELLANEOUS) ×3 IMPLANT
PAD CAST 3X4 CTTN HI CHSV (CAST SUPPLIES) ×1 IMPLANT
PAD CAST 4YDX4 CTTN HI CHSV (CAST SUPPLIES) ×1 IMPLANT
PADDING CAST COTTON 3X4 STRL (CAST SUPPLIES) ×2
PADDING CAST COTTON 4X4 STRL (CAST SUPPLIES) ×2
PADDING CAST COTTON 6X4 STRL (CAST SUPPLIES) IMPLANT
PLATE COMP 2X52 6H (Plate) ×3 IMPLANT
PLATE POST FIBULA ALPHA 7H (Plate) ×3 IMPLANT
PLATE RECON F/2.7X48 6H (Plate) ×3 IMPLANT
PLATE RECON F/2.7X64 8H (Plate) ×3 IMPLANT
PLATE T SHAPE LOCKING 2.5MM (Plate) ×3 IMPLANT
SCREW ACE CAN 4.0 30M (Screw) ×3 IMPLANT
SCREW CORTICAL 2.7MM  24MM (Screw) ×2 IMPLANT
SCREW CORTICAL 2.7MM  28MM (Screw) ×2 IMPLANT
SCREW CORTICAL 2.7MM  32MM (Screw) ×2 IMPLANT
SCREW CORTICAL 2.7MM  38MM (Screw) ×2 IMPLANT
SCREW CORTICAL 2.7MM  42MM (Screw) ×4 IMPLANT
SCREW CORTICAL 2.7MM 24MM (Screw) ×1 IMPLANT
SCREW CORTICAL 2.7MM 28MM (Screw) ×1 IMPLANT
SCREW CORTICAL 2.7MM 32MM (Screw) ×1 IMPLANT
SCREW CORTICAL 2.7MM 34MM (Screw) ×3 IMPLANT
SCREW CORTICAL 2.7MM 38MM (Screw) ×1 IMPLANT
SCREW CORTICAL 2.7MM 42MM (Screw) ×2 IMPLANT
SCREW CORTICAL 2.7MM 44MM (Screw) ×3 IMPLANT
SCREW CORTICAL 2.7X14MM (Screw) ×3 IMPLANT
SCREW CORTICAL 2.7X18MM (Screw) ×3 IMPLANT
SCREW CORTICAL 2.7X36MM (Screw) ×6 IMPLANT
SCREW CORTICAL 2.7X44MM (Screw) ×3 IMPLANT
SCREW LOCK FT ST MFS 2.7X30 (Screw) ×3 IMPLANT
SCREW LOCK FT ST MFS 2.7X32 (Screw) ×9 IMPLANT
SCREW LOCK PLATE R3 2.7X24 ×6 IMPLANT
SCREW NON LOCK PLATE 2.7X16M (Screw) ×6 IMPLANT
SCREW NON LOCKING PLATE 2.7X14 (Screw) ×3 IMPLANT
SCREW PEG 2.5X38 (Screw) ×3 IMPLANT
SCREW PEG 2.5X40 (Screw) ×3 IMPLANT
SPONGE LAP 18X18 RF (DISPOSABLE) ×3 IMPLANT
STAPLER VISISTAT 35W (STAPLE) ×3 IMPLANT
STOCKINETTE IMPERVIOUS LG (DRAPES) IMPLANT
SUCTION FRAZIER HANDLE 10FR (MISCELLANEOUS) ×2
SUCTION TUBE FRAZIER 10FR DISP (MISCELLANEOUS) ×1 IMPLANT
SUT ETHILON 3 0 PS 1 (SUTURE) ×12 IMPLANT
SUT PROLENE 4 0 TF (SUTURE) ×3 IMPLANT
SUT VIC AB 0 CT1 27 (SUTURE) ×2
SUT VIC AB 0 CT1 27XBRD ANBCTR (SUTURE) ×1 IMPLANT
SUT VIC AB 1 CT1 27 (SUTURE) ×2
SUT VIC AB 1 CT1 27XBRD ANBCTR (SUTURE) ×1 IMPLANT
SUT VIC AB 2-0 CT1 27 (SUTURE) ×6
SUT VIC AB 2-0 CT1 TAPERPNT 27 (SUTURE) ×3 IMPLANT
TOWEL OR 17X24 6PK STRL BLUE (TOWEL DISPOSABLE) ×3 IMPLANT
TOWEL OR 17X26 10 PK STRL BLUE (TOWEL DISPOSABLE) ×6 IMPLANT
TRAY FOLEY MTR SLVR 16FR STAT (SET/KITS/TRAYS/PACK) IMPLANT
TUBE CONNECTING 12'X1/4 (SUCTIONS) ×1
TUBE CONNECTING 12X1/4 (SUCTIONS) ×2 IMPLANT
WATER STERILE IRR 1000ML POUR (IV SOLUTION) ×6 IMPLANT
YANKAUER SUCT BULB TIP NO VENT (SUCTIONS) ×3 IMPLANT

## 2018-05-21 NOTE — Anesthesia Procedure Notes (Signed)
Anesthesia Regional Block: Adductor canal block   Pre-Anesthetic Checklist: ,, timeout performed, Correct Patient, Correct Site, Correct Laterality, Correct Procedure, Correct Position, site marked, Risks and benefits discussed,  Surgical consent,  Pre-op evaluation,  At surgeon's request and post-op pain management  Laterality: Right and Lower  Prep: chloraprep       Needles:  Injection technique: Single-shot  Needle Type: Echogenic Needle     Needle Length: 9cm  Needle Gauge: 21     Additional Needles:   Procedures:,,,, ultrasound used (permanent image in chart),,,,  Narrative:  Start time: 05/21/2018 7:05 AM End time: 05/21/2018 7:11 AM Injection made incrementally with aspirations every 5 mL.  Performed by: Personally  Anesthesiologist: Jairo Ben, MD  Additional Notes: Pt identified in Holding room.  Monitors applied. Working IV access confirmed. Sterile prep R thigh.  #21ga ECHOgenic needle into adductor canal with US guidance.  10cc 0.5% Bupivacaine with 1:200k epi injected incrementally after negative test dose.  Patient asymptomatic, VSS, no heme aspirated, tolerated well.  Sandford Craze, MD

## 2018-05-21 NOTE — Plan of Care (Signed)
  Problem: Clinical Measurements: Goal: Ability to maintain clinical measurements within normal limits will improve Outcome: Progressing   Problem: Activity: Goal: Risk for activity intolerance will decrease Outcome: Progressing   Problem: Elimination: Goal: Will not experience complications related to bowel motility Outcome: Progressing   Problem: Pain Managment: Goal: General experience of comfort will improve Outcome: Progressing   Problem: Safety: Goal: Ability to remain free from injury will improve Outcome: Progressing   

## 2018-05-21 NOTE — Brief Op Note (Signed)
05/21/2018  2:01 PM  PATIENT:  [name] [name]  48 y.o. male  PRE-OPERATIVE DIAGNOSIS:   1. Right Pilon Fracture, Tibia and Fibula 2. Retained External Fixator  POST-OPERATIVE DIAGNOSIS:   1. Right Pilon Fracture, Tibia and Fibula 2. Retained External Fixator   PROCEDURE:  Procedure(s): 1. OPEN REDUCTION INTERNAL FIXATION (ORIF) PILON FRACTURE. (Right), TIBIA AND FIBULA 2. APPLICATION OF SMALL WOUND VAC  SURGEON:  Surgeon(s) and Role:    * Myrene Galas, MD - Primary  PHYSICIAN ASSISTANT: Zane PAUL,PA-C  ANESTHESIA:   general AND REGIONAL  IVF: 1500 mL CRYSTALLOID  EBL:  125 mL   UOP: 500 mL+ (inserted at end of case)  BLOOD ADMINISTERED:none  DRAINS: none   LOCAL MEDICATIONS USED:  NONE  SPECIMEN:  No Specimen  DISPOSITION OF SPECIMEN:  N/A  COUNTS:  YES  TOURNIQUET:  * Missing tourniquet times found for documented tourniquets in log: [id number] *  DICTATION: .Other Dictation: Dictation Number [id number]  PLAN OF CARE: Admit to inpatient   PATIENT DISPOSITION:  PACU - hemodynamically stable.   Delay start of Pharmacological VTE agent (>24hrs) due to surgical blood loss or risk of bleeding: no

## 2018-05-21 NOTE — Anesthesia Procedure Notes (Signed)
Procedure Name: Intubation Date/Time: 05/21/2018 8:22 AM Performed by: Fransisca Kaufmann, CRNA Pre-anesthesia Checklist: Patient identified, Emergency Drugs available, Suction available and Patient being monitored Patient Re-evaluated:Patient Re-evaluated prior to induction Oxygen Delivery Method: Circle System Utilized Preoxygenation: Pre-oxygenation with 100% oxygen Induction Type: IV induction Ventilation: Mask ventilation without difficulty Laryngoscope Size: Miller and 2 Grade View: Grade I Tube type: Oral Tube size: 7.5 mm Number of attempts: 1 Airway Equipment and Method: Stylet and Oral airway Placement Confirmation: ETT inserted through vocal cords under direct vision,  positive ETCO2 and breath sounds checked- equal and bilateral Secured at: 22 cm Tube secured with: Tape Dental Injury: Teeth and Oropharynx as per pre-operative assessment

## 2018-05-21 NOTE — Op Note (Signed)
[name], BRUINS MEDICAL RECORD ZO:[id number] ACCOUNT [id number] DATE OF BIRTH:[birth year] FACILITY: [facility] LOCATION: [facility] PHYSICIAN:Campbell Agramonte H. Zuhair Lariccia, MD  OPERATIVE REPORT  DATE OF PROCEDURE:  05/21/2018  PREOPERATIVE DIAGNOSES: 1.  Right tibial pilon fracture, tibia and fibula, with severe comminution. 2.  Retained external fixator.  POSTOPERATIVE DIAGNOSES: 1.  Right tibial pilon fracture, tibia and fibula, with severe comminution. 2.  Retained external fixator.  PROCEDURES:   1.  Open reduction internal fixation of right pilon fracture, tibia and fibula.   2.  Application of small wound VAC.  SURGEON:  Myrene Galas, MD  ASSISTANT:  Montez Morita, PA-C.  ANESTHESIA:  General and regional by Dr. Jairo Ben.   INTRAVENOUS FLUIDS:  1500 mL crystalloid.  ESTIMATED BLOOD LOSS:  125 mL UOP 500 mL plus (inserted at the end of the case)  BLOOD ADMINISTERED:  None.  DRAINS:  None.  TOURNIQUET:  None.  SPECIMENS:  None.  DISPOSITION:  To PACU.  CONDITION:  Stable.  BRIEF SUMMARY FOR PROCEDURE:  [name] is a very pleasant 48 year old male who fell seven feet off a ladder onto concrete sustaining severe pilon fracture of his right tibia and fibula.  He underwent immediate early external fixation to allow for a  soft tissue swelling and resolution.  He did develop some fracture blisters.  We followed him for several weeks.  He has been on Lovenox for DVT prophylaxis.  He now presents for definitive internal fixation.  We discussed with him the potential to leave  the external fixator in place, the elevated risk of infection, arthritis, nonunion, loss of motion and need for further surgery, among others.  We also discussed DVT, PE, anesthetic complications in addition to nerve and vessel injury.  After discussion  of all these, he wished to proceed.  SUMMARY OF PROCEDURE:  The patient was taken to the operating room where general anesthesia was induced.  He did  receive a preoperative block.  The right foot was scrubbed with chlorhexidine scrub brush and wash and then Betadine scrub and paint.  The  fixator was treated with multiple thick coats of Betadine as we intended to retain this throughout his operation.  After a timeout, a medial approach was made to the shaft of the tibia.  Dissection was carried down where the periosteum was incised  directly over the fracture site.  There was considerable shortening of this shaft fragment and attempt was made at mobilization with the thin Cobb and curette to remove hematoma from the fracture site and then a mini frag 24 recon plate from [employer] was  affixed to the medial side.  A distraction clamp working off of 2 mm pins was then engaged within the plate using a unicortical screw for rotational stability.  This was distracted as much as possible and then an eccentric screw placed within the plate  at an angle to further reduce this segment bringing it out to length and buttressing it.  This was partially successful.  We then applied an additional posteromedial plate, which was directly in line with the force of displacement using a [employer]immer DCP mini  fragment plate.  Two screws were placed into this obtaining bicortical purchase proximal to the exit of the fracture and allowing it to interdigitate.  Additional lag screws were placed through the plate.  Once this was restored, we had sufficient  column length posteromedially to turn our attention to the distal anterolateral corner of the tibial pilon fracture.  Montez Morita, PA-C, was present and  necessary for assistance with achieving length and a provisional definitive fixation of the tibial  shaft fracture.  A 4 cm longitudinal incision was made over the Tillaux fragment of the anterolateral corner after using C-arm to confirm this.  I was able to incise the retinaculum and sweep the extensor tendons medially.  I was careful not to disrupt the periosteum or  any  periosteal healing.  A bone tamp was used to reduce this segment.  I was able to then trim a 2.5 mm plate from the [employer] system, which also allowed for 2.7 mm titanium screws with the help of my assistant in the front.  I obtained purchase in the  medial anterior tibia and lagged this posteriorly.  Once this was secure, I then stretched the plate and applied an additional screw with eccentric placement from anterolateral to posterolateral and thereby compress the anterior fracture line and the  Tillaux fragment.  We watched on fluoroscopy to make sure that all screws were safely within the subchondral bone and achieving compression.  I did use some of the [employer] 2.5 mm screws.  As I continued into the metaphysis, I had to identify the plane and  mobilize the plane between the dorsalis pedis artery and the deep peroneal nerve, sweeping them anteriorly, but they were difficult to mobilize from the periosteum and initially were transiently under the plate.  There was no rent in the artery.  We  were able to get the plate just above the exit of the fracture and to perform a type of a buttress with the plate in this manner.  We overdrilled the near cortex to lag this to the posteromedial cortex.  This gave us excellent compression and alignment.   The wound was irrigated thoroughly and then we turned our attention to the fibula.  Here, a posterolateral incision was made to ensure enough a bridge between with the anterolateral incision.  I incised the peroneal sheath proximally, but did not detach  the retinaculum and was able to mobilize the tendons posteriorly and apply a posterior buttress plate with anteriorly directed screws securing locked screws in the distal aspect of the tibia, but beginning with all standard fixation and 3 bicortical  screws proximally.  This appeared to restore alignment in the sagittal plane for the fibula.  I did not take down the periosteum here.  Ultimately I repaired back the most  distal portion of the tendon sheath just above the retinaculum.  Montez MoritaKeith Paul again  positioned and helped to assist with exposure, reduction and allowed for a definitive fixation.  On the medial side, a centimeter incision was made after using x-ray for appropriate position.  The 1.6 guide pin was used to place a 30 mm partially  threaded cannulated screw obtaining excellent compression.  Final images showed appropriate reduction of the malleoli medially, laterally and anteromedially with compression across the articular surface, which remained in good alignment and reduced and  also at the shaft.  We were unable to place a single bulky implant for control of this pilon fracture and instead will maintain the external fixator as our primary mode of neutralization of the entire construct.  Again, Montez MoritaKeith Paul was present and  assisting throughout.  A standard layered closure was performed with 0 Vicryl, 2-0 Vicryl and 3-0 vertical mattress nylon sutures.  A wound VAC was applied as well given the delicate soft tissue condition needed to control swelling.  It should be noted that we did attempt to Doppler the  dorsalis pedis artery following completion of instrumentation and were unable to obtain a good signal.  We had excellent peroneal and posterior tibial signal.  We strongly suspect this is due to the  low temperature in the operating room, which was even more low than usual because of some humidity issues present today.  I did ask my colleague, Dr. Cari Caraway, and if he would recommend any further intraoperative evaluation at this time and he did  not think that would be helpful or advisable as we anticipated, but we were glad that he was kind enough to visually inspect the area.  It was certainly completely in continuity and one small area of venous oozing was ligated with 4-0 Prolene very  superficially.    PROGNOSIS:  We anticipate retaining the fixator for 4 weeks and then converting him into a cast  or a Cam boot for motion.  He will remain on DVT prophylaxis.  A Foley catheter was placed at the end of the case because of the extended operative time and  this returned 500 mL.  It will be removed when he is able to resume urinary function on his own.  He remains at elevated risk for arthritis.  His talus appears to be well reduced at this time.  We are hopeful that goes on to unite or he may be amenable  to arthroscopic debridement.  TN/NUANCE  D:05/21/2018 T:05/21/2018 JOB:[id number]

## 2018-05-21 NOTE — Transfer of Care (Signed)
Immediate Anesthesia Transfer of Care Note  Patient: [name] [name]  Procedure(s) Performed: OPEN REDUCTION INTERNAL FIXATION (ORIF) PILON FRACTURE. (Right Leg Lower)  Patient Location: PACU  Anesthesia Type:General  Level of Consciousness: awake, alert , oriented and sedated  Airway & Oxygen Therapy: Patient Spontanous Breathing and Patient connected to nasal cannula oxygen  Post-op Assessment: Report given to RN, Post -op Vital signs reviewed and stable and Patient moving all extremities  Post vital signs: Reviewed and stable  Last Vitals:  Vitals Value Taken Time  BP 114/78 05/21/2018  2:24 PM  Temp    Pulse 95 05/21/2018  2:28 PM  Resp 19 05/21/2018  2:28 PM  SpO2 96 % 05/21/2018  2:28 PM  Vitals shown include unvalidated device data.  Last Pain:  Vitals:   05/21/18 0540  TempSrc: Oral         Complications: No apparent anesthesia complications

## 2018-05-21 NOTE — Anesthesia Postprocedure Evaluation (Signed)
Anesthesia Post Note  Patient: Derwyn Heidebrecht  Procedure(s) Performed: OPEN REDUCTION INTERNAL FIXATION (ORIF) PILON FRACTURE. (Right Leg Lower)     Patient location during evaluation: PACU Anesthesia Type: General and Regional Level of consciousness: awake and alert, patient cooperative and oriented Pain management: pain level controlled (pt states having no pain) Vital Signs Assessment: post-procedure vital signs reviewed and stable Respiratory status: spontaneous breathing, nonlabored ventilation and respiratory function stable Cardiovascular status: blood pressure returned to baseline and stable Postop Assessment: no apparent nausea or vomiting Anesthetic complications: no    Last Vitals:  Vitals:   05/21/18 0552 05/21/18 1424  BP: (!) 135/95 114/78  Pulse:  93  Resp:  17  Temp:    SpO2:  99%    Last Pain:  Vitals:   05/21/18 0540  TempSrc: Oral                 Jaquavion Mccannon,E. Burnice Oestreicher

## 2018-05-21 NOTE — H&P (Signed)
H&P documentation: 04/27/18  -History and Physical Reviewed  -Patient has been re-examined  -No change in the plan of care   I discussed with the patient the risks and benefits of surgery for his right pilon and talus fractures, including the possibility of infection, nerve injury, vessel injury, wound breakdown, arthritis, symptomatic hardware, DVT/ PE, loss of motion, malunion, nonunion, and need for further surgery among others.  We also specifically discussed the elevated risk of soft tissue breakdown that could lead to amputation.  He acknowledged these risks and wished to proceed.   Budd Palmer

## 2018-05-21 NOTE — Plan of Care (Signed)
  Problem: Education: Goal: Knowledge of General Education information will improve Description: Including pain rating scale, medication(s)/side effects and non-pharmacologic comfort measures Outcome: Progressing   Problem: Clinical Measurements: Goal: Respiratory complications will improve Outcome: Not Applicable   Problem: Nutrition: Goal: Adequate nutrition will be maintained Outcome: Progressing   Problem: Pain Managment: Goal: General experience of comfort will improve Outcome: Progressing   

## 2018-05-21 NOTE — Progress Notes (Signed)
Orthopedic Tech Progress Note Patient Details:  [name] [name] 1970/03/16 [id number]  Ortho Devices Ortho Device/Splint Location: Trapeze bar Ortho Device/Splint Interventions: Application   Post Interventions Patient Tolerated: Well Instructions Provided: Care of device   Saul Fordyce 05/21/2018, 6:03 PM

## 2018-05-21 NOTE — Anesthesia Procedure Notes (Signed)
Anesthesia Regional Block: Popliteal block   Pre-Anesthetic Checklist: ,, timeout performed, Correct Patient, Correct Site, Correct Laterality, Correct Procedure, Correct Position, site marked, Risks and benefits discussed,  Surgical consent,  Pre-op evaluation,  At surgeon's request and post-op pain management  Laterality: Right and Lower  Prep: chloraprep       Needles:  Injection technique: Single-shot  Needle Type: Echogenic Stimulator Needle     Needle Length: 9cm  Needle Gauge: 21     Additional Needles:   Procedures:, nerve stimulator,,, ultrasound used (permanent image in chart),,,,   Nerve Stimulator or Paresthesia:  Response: toe dorsiflexion, 0.4 mA, 0.1 ms,   Additional Responses:   Narrative:  Start time: 05/21/2018 7:12 AM End time: 05/21/2018 7:23 AM Injection made incrementally with aspirations every 5 mL.  Performed by: Personally  Anesthesiologist: Jairo Ben, MD  Additional Notes: Pt identified in Holding room.  Monitors applied. Working IV access confirmed. Sterile prep distal R lateral thigh.  #21ga PNS to toe twitch at 0.27mA threshold with US guidance.  30cc 0.5% Bupivacaine with 1:200k epi injected incrementally after negative test dose.  Patient asymptomatic, VSS, no heme aspirated, tolerated well.  Sandford Craze, MD

## 2018-05-22 DIAGNOSIS — S82401A Unspecified fracture of shaft of right fibula, initial encounter for closed fracture: Secondary | ICD-10-CM | POA: Diagnosis present

## 2018-05-22 LAB — PTH, INTACT AND CALCIUM
Calcium, Total (PTH): 9.8 mg/dL (ref 8.7–10.2)
PTH: 22 pg/mL (ref 15–65)

## 2018-05-22 LAB — CBC
HCT: 32.4 % — ABNORMAL LOW (ref 39.0–52.0)
Hemoglobin: 10.9 g/dL — ABNORMAL LOW (ref 13.0–17.0)
MCH: 32.2 pg (ref 26.0–34.0)
MCHC: 33.6 g/dL (ref 30.0–36.0)
MCV: 95.9 fL (ref 80.0–100.0)
Platelets: 325 10*3/uL (ref 150–400)
RBC: 3.38 MIL/uL — ABNORMAL LOW (ref 4.22–5.81)
RDW: 12.3 % (ref 11.5–15.5)
WBC: 9.2 10*3/uL (ref 4.0–10.5)
nRBC: 0 % (ref 0.0–0.2)

## 2018-05-22 LAB — BASIC METABOLIC PANEL
Anion gap: 9 (ref 5–15)
BUN: 10 mg/dL (ref 6–20)
CO2: 25 mmol/L (ref 22–32)
Calcium: 8.6 mg/dL — ABNORMAL LOW (ref 8.9–10.3)
Chloride: 105 mmol/L (ref 98–111)
Creatinine, Ser: 0.94 mg/dL (ref 0.61–1.24)
GFR calc Af Amer: >60 mL/min (ref >60)
GFR calc non Af Amer: >60 mL/min (ref >60)
Glucose, Bld: 102 mg/dL — ABNORMAL HIGH (ref 70–99)
Potassium: 4 mmol/L (ref 3.5–5.1)
Sodium: 139 mmol/L (ref 135–145)

## 2018-05-22 MED ORDER — ENOXAPARIN SODIUM 40 MG/0.4ML ~~LOC~~ SOLN: 40.0000 mg | SUBCUTANEOUS | Status: DC

## 2018-05-22 MED ORDER — METHOCARBAMOL 750 MG PO TABS: 750.0000 mg | ORAL_TABLET | Freq: Three times a day (TID) | ORAL | 0 refills | Status: AC | PRN

## 2018-05-22 MED ORDER — DOCUSATE SODIUM 100 MG PO CAPS: 100.0000 mg | ORAL_CAPSULE | Freq: Two times a day (BID) | ORAL | 0 refills | Status: DC

## 2018-05-22 NOTE — Progress Notes (Signed)
Pt discontinued foley cath, therapy done, waiting for the patient to urinate on his own before discharge.

## 2018-05-22 NOTE — Progress Notes (Signed)
Discharge Home. AVS given and explained by previous nurse, no questions verbalized by patient.

## 2018-05-22 NOTE — Discharge Instructions (Signed)
[facility] Discharge Instructions   General Discharge Instructions  WEIGHT BEARING STATUS: Nonweightbearing Right leg   RANGE OF MOTION/ACTIVITY: ok to move toes and knee on right leg. Activity as tolerated while maintaining nonweightbearing on Right leg   Bone health:  Continue with vitamin d and vitamin c to promote good bone and soft tissue healing   Wound Care: you have a prevena dressing covering your surgical incision, this is a vacuum type dressing that promotes a good healing environment. Do not remove purple sponge or clear dressing.  Starting on 05/24/2018 you may remove the ace wraps and white cotton layer as well as gauze around the external fixator pins.  Start routine pin care on 05/24/2018.  Make sure the prevena is charged, when seated for a long period of time would plug in so it can be charging. Call office with any questions    Discharge Pin Site Instructions  Dress pins daily with Kerlix roll starting on POD 2. Wrap the Kerlix so that it tamps the skin down around the pin-skin interface to prevent/limit motion of the skin relative to the pin.  (Pin-skin motion is the primary cause of pain and infection related to external fixator pin sites).  Remove any crust or coagulum that may obstruct drainage with a saline moistened gauze or soap and water.  After POD 3, if there is no discernable drainage on the pin site dressing, the interval for change can by increased to every other day.  You may shower with the fixator, cleaning all pin sites gently with soap and water.  If you have a surgical wound this needs to be completely dry and without drainage before showering.  The extremity can be lifted by the fixator to facilitate wound care and transfers.  Notify the office/Doctor if you experience increasing drainage, redness, or pain from a pin site, or if you notice purulent (thick, snot-like) drainage.    DVT/PE prophylaxis: continue taking lovenox daily  until the prescription is complete  Diet: as you were eating previously.  Can use over the counter stool softeners and bowel preparations, such as Miralax, to help with bowel movements.  Narcotics can be constipating.  Be sure to drink plenty of fluids  PAIN MEDICATION USE AND EXPECTATIONS  You have likely been given narcotic medications to help control your pain.  After a traumatic event that results in an fracture (broken bone) with or without surgery, it is ok to use narcotic pain medications to help control one's pain.  We understand that everyone responds to pain differently and each individual patient will be evaluated on a regular basis for the continued need for narcotic medications. Ideally, narcotic medication use should last no more than 6-8 weeks (coinciding with fracture healing).   As a patient it is your responsibility as well to monitor narcotic medication use and report the amount and frequency you use these medications when you come to your office visit.   We would also advise that if you are using narcotic medications, you should take a dose prior to therapy to maximize you participation.  IF YOU ARE ON NARCOTIC MEDICATIONS IT IS NOT PERMISSIBLE TO OPERATE A MOTOR VEHICLE (MOTORCYCLE/CAR/TRUCK/MOPED) OR HEAVY MACHINERY DO NOT MIX NARCOTICS WITH OTHER CNS (CENTRAL NERVOUS SYSTEM) DEPRESSANTS SUCH AS ALCOHOL   STOP SMOKING OR USING NICOTINE PRODUCTS!!!!  As discussed nicotine severely impairs your body's ability to heal surgical and traumatic wounds but also impairs bone healing.  Wounds and bone heal by forming  microscopic blood vessels (angiogenesis) and nicotine is a vasoconstrictor (essentially, shrinks blood vessels).  Therefore, if vasoconstriction occurs to these microscopic blood vessels they essentially disappear and are unable to deliver necessary nutrients to the healing tissue.  This is one modifiable factor that you can do to dramatically increase your chances of healing  your injury.    (This means no smoking, no nicotine gum, patches, etc)  DO NOT USE NONSTEROIDAL ANTI-INFLAMMATORY DRUGS (NSAID'S)  Using products such as Advil (ibuprofen), Aleve (naproxen), Motrin (ibuprofen) for additional pain control during fracture healing can delay and/or prevent the healing response.  If you would like to take over the counter (OTC) medication, Tylenol (acetaminophen) is ok.  However, some narcotic medications that are given for pain control contain acetaminophen as well. Therefore, you should not exceed more than 4000 mg of tylenol in a day if you do not have liver disease.  Also note that there are may OTC medicines, such as cold medicines and allergy medicines that my contain tylenol as well.  If you have any questions about medications and/or interactions please ask your doctor/PA or your pharmacist.      ICE AND ELEVATE INJURED/OPERATIVE EXTREMITY  Using ice and elevating the injured extremity above your heart can help with swelling and pain control.  Icing in a pulsatile fashion, such as 20 minutes on and 20 minutes off, can be followed.    Do not place ice directly on skin. Make sure there is a barrier between to skin and the ice pack.    Using frozen items such as frozen peas works well as the conform nicely to the are that needs to be iced.  USE AN ACE WRAP OR TED HOSE FOR SWELLING CONTROL  In addition to icing and elevation, Ace wraps or TED hose are used to help limit and resolve swelling.  It is recommended to use Ace wraps or TED hose until you are informed to stop.    When using Ace Wraps start the wrapping distally (farthest away from the body) and wrap proximally (closer to the body)   Example: If you had surgery on your leg or thing and you do not have a splint on, start the ace wrap at the toes and work your way up to the thigh        If you had surgery on your upper extremity and do not have a splint on, start the ace wrap at your fingers and work your way  up to the upper arm  IF YOU ARE IN A SPLINT OR CAST DO NOT REMOVE IT FOR ANY REASON   If your splint gets wet for any reason please contact the office immediately. You may shower in your splint or cast as long as you keep it dry.  This can be done by wrapping in a cast cover or garbage back (or similar)  Do Not stick any thing down your splint or cast such as pencils, money, or hangers to try and scratch yourself with.  If you feel itchy take benadryl as prescribed on the bottle for itching  IF YOU ARE IN A CAM BOOT (BLACK BOOT)  You may remove boot periodically. Perform daily dressing changes as noted below.  Wash the liner of the boot regularly and wear a sock when wearing the boot. It is recommended that you sleep in the boot until told otherwise  CALL THE OFFICE WITH ANY QUESTIONS OR CONCERNS: [phone]

## 2018-05-22 NOTE — Progress Notes (Signed)
Orthopedic Trauma Service Progress Note  Patient ID: [name] [name] MRN: [id number] DOB/AGE: [birth year] 48 y.o.  Subjective:  Doing well No complaints Mild pain  Block wore off around 0500 today   Wants to go home today  No acute issues of note    Review of Systems  Constitutional: Negative for chills and fever.  Respiratory: Negative for shortness of breath and wheezing.   Cardiovascular: Negative for chest pain and palpitations.  Gastrointestinal: Negative for abdominal pain, nausea and vomiting.      Objective:   VITALS:   Vitals:   05/21/18 1713 05/21/18 1954 05/21/18 2350 05/22/18 0444  BP:  119/90 120/82 117/87  Pulse:  100 96 77  Resp:      Temp: 97.7 F (36.5 C) 98.8 F (37.1 C) 98.5 F (36.9 C) 97.7 F (36.5 C)  TempSrc: Oral Oral Oral Oral  SpO2:  96% 98% 98%  Weight:      Height:        Estimated body mass index is 22.18 kg/m as calculated from the following:   Height as of this encounter: 5' 8.5" (1.74 m).   Weight as of this encounter: 67.1 kg.   Intake/Output      05/12 0701 - 05/13 0700 05/13 0701 - 05/14 0700   P.O. 1480    I.V. (mL/kg) 1716.8 (25.6)    IV Piggyback 98.3    Total Intake(mL/kg) 3295.1 (49.1)    Urine (mL/kg/hr) 4050 (2.5)    Blood 125    Total Output 4175    Net -879.9           LABS  Results for orders placed or performed during the hospital encounter of 05/21/18 (from the past 24 hour(s))  CBC     Status: Abnormal   Collection Time: 05/21/18  5:25 PM  Result Value Ref Range   WBC 10.0 4.0 - 10.5 K/uL   RBC 3.76 (L) 4.22 - 5.81 MIL/uL   Hemoglobin 12.3 (L) 13.0 - 17.0 g/dL   HCT 60.7 (L) 37.1 - 06.2 %   MCV 94.4 80.0 - 100.0 fL   MCH 32.7 26.0 - 34.0 pg   MCHC 34.6 30.0 - 36.0 g/dL   RDW 69.4 85.4 - 62.7 %   Platelets 370 150 - 400 K/uL   nRBC 0.0 0.0 - 0.2 %  Creatinine, serum     Status: None   Collection Time: 05/21/18  5:25 PM   Result Value Ref Range   Creatinine, Ser 0.73 0.61 - 1.24 mg/dL   GFR calc non Af Amer >60 >60 mL/min   GFR calc Af Amer >60 >60 mL/min  Basic metabolic panel     Status: Abnormal   Collection Time: 05/22/18  4:00 AM  Result Value Ref Range   Sodium 139 135 - 145 mmol/L   Potassium 4.0 3.5 - 5.1 mmol/L   Chloride 105 98 - 111 mmol/L   CO2 25 22 - 32 mmol/L   Glucose, Bld 102 (H) 70 - 99 mg/dL   BUN 10 6 - 20 mg/dL   Creatinine, Ser 0.35 0.61 - 1.24 mg/dL   Calcium 8.6 (L) 8.9 - 10.3 mg/dL   GFR calc non Af Amer >60 >60 mL/min   GFR calc Af Amer >60 >60 mL/min   Anion gap 9 5 - 15  CBC     Status: Abnormal   Collection Time: 05/22/18  4:00 AM  Result Value Ref Range   WBC 9.2 4.0 - 10.5 K/uL   RBC 3.38 (L) 4.22 - 5.81 MIL/uL   Hemoglobin 10.9 (L) 13.0 - 17.0 g/dL   HCT 16.132.4 (L) 09.639.0 - 04.552.0 %   MCV 95.9 80.0 - 100.0 fL   MCH 32.2 26.0 - 34.0 pg   MCHC 33.6 30.0 - 36.0 g/dL   RDW 40.912.3 81.111.5 - 91.415.5 %   Platelets 325 150 - 400 K/uL   nRBC 0.0 0.0 - 0.2 %     PHYSICAL EXAM:   Gen: awake and alert, resting comfortably in bed, NAD, pleasant  Lungs: CTA B  Cardiac: RRR Abd: + BS, NTND Ext:       Right Lower Extremity   Ex fix stable  pinsite dressings c/d/i  prevena functioning well, no drainage  Swelling controlled  EHL, FHL, lesser toe motor functions itnact   No pain with passive stretch   Ext warm   Brisk cap refill  Good distal perfusion   Assessment/Plan: 1 Day Post-Op   Active Problems:   Tibial fracture   Closed right pilon fracture   Current nicotine use   Talus fracture, right lateral talar process   Closed right fibular fracture   Anti-infectives (From admission, onward)   Start     Dose/Rate Route Frequency Ordered Stop   05/21/18 1800  ceFAZolin (ANCEF) IVPB 1 g/50 mL premix     1 g 100 mL/hr over 30 Minutes Intravenous Every 6 hours 05/21/18 1706 05/22/18 0622   05/21/18 0600  ceFAZolin (ANCEF) IVPB 2g/100 mL premix     2 g 200 mL/hr over 30  Minutes Intravenous On call to O.R. 05/21/18 [id number] 05/21/18 1225    .  POD/HD#: 1  48 y/o male s/p fall off ladder with closed R pilon fracture and closed R tibial shaft fracture    - fall off ladder   - closed comminuted R pilon, tibial shaft and fibula fractures s/p ORIF with retention of Ex fix              NWB R leg             Aggressive ice and elevation of R extremity              Prevena dressing in place over incisions, we will remove at office follow up on 05/29/2018  Ok to start pin care and removing ace/webril on 05/24/2018  Toe motion as tolerated      - Pain management:             norco              Robaxin   - ABL anemia/Hemodynamics             Stable   - Medical issues              Nicotine use                         Discussed negative effects of nicotine on bone and wound healing. It can also contribute to prolonged swelling    - DVT/PE prophylaxis:             Lovenox x 2 more weeks  - ID:              periop abx completed    - Metabolic  Bone Disease:             mild vitamin d insufficiency    Vitamin d and vitamin c started at last admission    - Activity:             Activity as tolerated while maintaining NWB R leg    - FEN/GI prophylaxis/Foley/Lines:             Reg diet             dc foley      -Ex-fix/Splint care:            pin care starting on 05/24/2018   - Impediments to fracture healing:             High energy articular injury             Significant soft tissue injury              Nicotine use    - Dispo:             Dc home today after foley dc'd and pt voids and after he works with therapies             Follow up with ortho in 1 week for dressing change      Mearl Latin, PA-C [phone] (C) 05/22/2018, 8:47 AM  [facility] [address] [place] [address] [phone] Collier Bullock (F)

## 2018-05-22 NOTE — Progress Notes (Signed)
OT Cancellation Note  Patient Details Name: Brixon Morie MRN: [id number] DOB: [birth year]   Cancelled Treatment:    Reason Eval/Treat Not Completed: OT screened, no needs identified, will sign off. Pt is functioning modified independently.  Evern Bio 05/22/2018, 11:14 AM  Martie Round, OTR/L Acute Rehabilitation Services Pager: [phone] Office: [phone]

## 2018-05-22 NOTE — Progress Notes (Signed)
Pt for discharge going home and had a therapy and he urinated 700 ml, given health teachings, next appointment, discontinued peripheral IV line, given all his personal belongings, wound site dry and intact, waiting for his wife to pick him up, alert and oriented, given pain meds  2x, no s/s of distress at this time.

## 2018-05-22 NOTE — Evaluation (Signed)
Physical Therapy Evaluation & Discharge Patient Details Name: [name] [name] MRN: [id number] DOB: 08/24/1970 Today's Date: 05/22/2018   History of Present Illness  Pt is a 48 y.o. male with previous fall from ladder sustaining tibia/fibula fx (s/p ex fix 04/28/18) and talus fx; now admitted 05/21/18 s/p pilon fx ORIF with retention of ex fix and wound vac application. No pertinent PMH on file.    Clinical Impression  Patient evaluated by Physical Therapy with no further acute PT needs identified. PTA, pt has been RLE NWB since 04/28/18 and functioning independently at home with RW; has assist from family if needed. Today, pt mod indep with RW; demonstrates good R knee/hip ROM and strength. Reviewed precautions, therex, and potential for outpatient PT once able to start weight bearing for return to sport activity. All education has been completed and the patient has no further questions. PT is signing off. Thank you for this referral.    Follow Up Recommendations No PT follow up    Equipment Recommendations  None recommended by PT    Recommendations for Other Services       Precautions / Restrictions Precautions Precautions: Fall Restrictions Weight Bearing Restrictions: Yes RLE Weight Bearing: Non weight bearing      Mobility  Bed Mobility Overal bed mobility: Independent                Transfers Overall transfer level: Modified independent Equipment used: Rolling walker (2 wheeled)                Ambulation/Gait Ambulation/Gait assistance: Modified independent (Device/Increase time) Gait Distance (Feet): 30 Feet Assistive device: Rolling walker (2 wheeled)       General Gait Details: Mod indep utilizing hop-to technique on LLE with RW; good ability to maintain RLE NWB precautions  Stairs            Wheelchair Mobility    Modified Rankin (Stroke Patients Only)       Balance Overall balance assessment: Modified Independent                                            Pertinent Vitals/Pain Pain Assessment: Faces Faces Pain Scale: Hurts a little bit Pain Location: RLE Pain Descriptors / Indicators: Sore    Home Living Family/patient expects to be discharged to:: Private residence Living Arrangements: Spouse/significant other Available Help at Discharge: Family Type of Home: House Home Access: Stairs to enter Entrance Stairs-Rails: Can reach both Entrance Stairs-Number of Steps: 2 Home Layout: Two level Home Equipment: Environmental consultantWalker - 2 wheels;Bedside commode;Crutches Additional Comments: Patients bedroom is on the first level. Has his wife and 4 teenage children to help him at home.     Prior Function Level of Independence: Independent with assistive device(s)         Comments: Has been RLE NWB since previous sx 04/28/18, mod indep with RW since then; using BSC for showers     Hand Dominance   Dominant Hand: Right    Extremity/Trunk Assessment   Upper Extremity Assessment Upper Extremity Assessment: Overall WFL for tasks assessed    Lower Extremity Assessment Lower Extremity Assessment: RLE deficits/detail RLE Deficits / Details: s/p R lower leg ex fix retention; hip flex/ext and knee ext/flex >4/5       Communication   Communication: No difficulties  Cognition Arousal/Alertness: Awake/alert Behavior During Therapy: WFL for tasks assessed/performed Overall Cognitive Status:  Within Functional Limits for tasks assessed                                        General Comments      Exercises     Assessment/Plan    PT Assessment Patent does not need any further PT services  PT Problem List         PT Treatment Interventions      PT Goals (Current goals can be found in the Care Plan section)  Acute Rehab PT Goals PT Goal Formulation: All assessment and education complete, DC therapy    Frequency     Barriers to discharge        Co-evaluation                AM-PAC PT "6 Clicks" Mobility  Outcome Measure Help needed turning from your back to your side while in a flat bed without using bedrails?: None Help needed moving from lying on your back to sitting on the side of a flat bed without using bedrails?: None Help needed moving to and from a bed to a chair (including a wheelchair)?: None Help needed standing up from a chair using your arms (e.g., wheelchair or bedside chair)?: None Help needed to walk in hospital room?: None Help needed climbing 3-5 steps with a railing? : None 6 Click Score: 24    End of Session   Activity Tolerance: Patient tolerated treatment well Patient left: in chair;with call bell/phone within reach Nurse Communication: Mobility status PT Visit Diagnosis: Other abnormalities of gait and mobility (R26.89)    Time: 9093-1121 PT Time Calculation (min) (ACUTE ONLY): 13 min   Charges:   PT Evaluation $PT Eval Low Complexity: 1 Low     Ina Homes, PT, DPT Acute Rehabilitation Services  Pager [phone] Office [phone]  Malachy Chamber 05/22/2018, 11:35 AM

## 2018-05-22 NOTE — Discharge Summary (Signed)
Orthopaedic Trauma Service (OTS) Discharge Summary   Patient ID: [name] [name] MRN: [id number] DOB/AGE: 1970-02-24 48 y.o.  Admit date: 05/21/2018 Discharge date: 05/22/2018  Admission Diagnoses: Closed R tibial plafond fracture  Closed R fibula fracture  Closed R talus fracture Closed R tibial shaft fracture  Nicotine dependence  Vitamin d insufficiency   Discharge Diagnoses:  Principal Problem:   Closed right pilon fracture Active Problems:   Tibial fracture   Current nicotine use   Talus fracture, right lateral talar process   Closed right fibular fracture   Past Medical History:  Diagnosis Date  . Allergies    seasonal  . Current nicotine use 04/29/2018  . Talus fracture, right lateral talar process 04/30/2018     Procedures Performed: 05/21/2018- Dr. Carola Frost   Discharged Condition: good  Hospital Course:   Patient well-known 48 year old male who was initially treated April 2020 for complex right tibial plafond, right tibial shaft, fibular shaft and talus fracture.  He was initially treated with external fixation due to severe soft tissue swelling.  Presented this admission for ORIF of his complex distal tibia fracture.  Due to the extensive comminution we opted to leave the external fixator in place.  Procedures noted above were performed on 05/21/2018.  Patient tolerated surgery very well.  After surgery he was transferred to the PACU for recovery from anesthesia and then transferred to medical surgical floor for continued observation, pain control and therapies.  Patient's hospital stay was uncomplicated on postoperative day #1 he had a remarkable pain control and was requesting to be discharged home.  Patient worked with physical therapy and Occupational Therapy on postop day #1 he was able to void after removal of his Foley and he was mobilizing very well.  Patient discharged in stable condition on postoperative day #1.  Patient will follow-up in 7 to 10 days for  removal of his prevena incisional VAC.  He will remain nonweightbearing for a total of 8 weeks.  We will likely return to the OR in 6 weeks or so for removal of his external fixator.  Consults: None  Significant Diagnostic Studies: labs:    Results for ISAYA, CARIAS (MRN [id number]) as of 06/05/2018 14:31  Ref. Range 05/21/2018 17:25  Creatinine Latest Ref Range: 0.61 - 1.24 mg/dL 8.59  GFR, Est Non African American Latest Ref Range: >60 mL/min >60  GFR, Est African American Latest Ref Range: >60 mL/min >60  WBC Latest Ref Range: 4.0 - 10.5 K/uL 10.0  RBC Latest Ref Range: 4.22 - 5.81 MIL/uL 3.76 (L)  Hemoglobin Latest Ref Range: 13.0 - 17.0 g/dL 29.2 (L)  HCT Latest Ref Range: 39.0 - 52.0 % 35.5 (L)  MCV Latest Ref Range: 80.0 - 100.0 fL 94.4  MCH Latest Ref Range: 26.0 - 34.0 pg 32.7  MCHC Latest Ref Range: 30.0 - 36.0 g/dL 44.6  RDW Latest Ref Range: 11.5 - 15.5 % 12.4  Platelets Latest Ref Range: 150 - 400 K/uL 370  nRBC Latest Ref Range: 0.0 - 0.2 % 0.0   Treatments: IV hydration, antibiotics: Ancef, analgesia: dilaudid, norco, tylenol , therapies: PT, OT and RN and surgery: as above  Discharge Exam:          Orthopedic Trauma Service Progress Note  Patient ID: Edyn Nordell MRN: [id number] DOB/AGE: 1970/09/15 48 y.o.  Subjective:  Doing well No complaints Mild pain  Block wore off around 0500 today   Wants to go home today  No acute issues of note  Review of Systems  Constitutional: Negative for chills and fever.  Respiratory: Negative for shortness of breath and wheezing.   Cardiovascular: Negative for chest pain and palpitations.  Gastrointestinal: Negative for abdominal pain, nausea and vomiting.      Objective:   VITALS:         Vitals:   05/21/18 1713 05/21/18 1954 05/21/18 2350 05/22/18 0444  BP:  119/90 120/82 117/87  Pulse:  100 96 77  Resp:      Temp: 97.7 F (36.5 C) 98.8 F (37.1 C) 98.5 F (36.9 C) 97.7 F (36.5 C)   TempSrc: Oral Oral Oral Oral  SpO2:  96% 98% 98%  Weight:      Height:        Estimated body mass index is 22.18 kg/m as calculated from the following:   Height as of this encounter: 5' 8.5" (1.74 m).   Weight as of this encounter: 67.1 kg.   Intake/Output      05/12 0701 - 05/13 0700 05/13 0701 - 05/14 0700   P.O. 1480    I.V. (mL/kg) 1716.8 (25.6)    IV Piggyback 98.3    Total Intake(mL/kg) 3295.1 (49.1)    Urine (mL/kg/hr) 4050 (2.5)    Blood 125    Total Output 4175    Net -879.9           LABS  LabResultsLast24Hours       Results for orders placed or performed during the hospital encounter of 05/21/18 (from the past 24 hour(s))  CBC     Status: Abnormal   Collection Time: 05/21/18  5:25 PM  Result Value Ref Range   WBC 10.0 4.0 - 10.5 K/uL   RBC 3.76 (L) 4.22 - 5.81 MIL/uL   Hemoglobin 12.3 (L) 13.0 - 17.0 g/dL   HCT 40.9 (L) 81.1 - 91.4 %   MCV 94.4 80.0 - 100.0 fL   MCH 32.7 26.0 - 34.0 pg   MCHC 34.6 30.0 - 36.0 g/dL   RDW 78.2 95.6 - 21.3 %   Platelets 370 150 - 400 K/uL   nRBC 0.0 0.0 - 0.2 %  Creatinine, serum     Status: None   Collection Time: 05/21/18  5:25 PM  Result Value Ref Range   Creatinine, Ser 0.73 0.61 - 1.24 mg/dL   GFR calc non Af Amer >60 >60 mL/min   GFR calc Af Amer >60 >60 mL/min  Basic metabolic panel     Status: Abnormal   Collection Time: 05/22/18  4:00 AM  Result Value Ref Range   Sodium 139 135 - 145 mmol/L   Potassium 4.0 3.5 - 5.1 mmol/L   Chloride 105 98 - 111 mmol/L   CO2 25 22 - 32 mmol/L   Glucose, Bld 102 (H) 70 - 99 mg/dL   BUN 10 6 - 20 mg/dL   Creatinine, Ser 0.86 0.61 - 1.24 mg/dL   Calcium 8.6 (L) 8.9 - 10.3 mg/dL   GFR calc non Af Amer >60 >60 mL/min   GFR calc Af Amer >60 >60 mL/min   Anion gap 9 5 - 15  CBC     Status: Abnormal   Collection Time: 05/22/18  4:00 AM  Result Value Ref Range   WBC 9.2 4.0 - 10.5 K/uL   RBC 3.38 (L) 4.22 - 5.81 MIL/uL    Hemoglobin 10.9 (L) 13.0 - 17.0 g/dL   HCT 57.8 (L) 46.9 - 62.9 %   MCV 95.9 80.0 - 100.0 fL  MCH 32.2 26.0 - 34.0 pg   MCHC 33.6 30.0 - 36.0 g/dL   RDW 61.9 50.9 - 32.6 %   Platelets 325 150 - 400 K/uL   nRBC 0.0 0.0 - 0.2 %       PHYSICAL EXAM:   Gen: awake and alert, resting comfortably in bed, NAD, pleasant  Lungs: CTA B  Cardiac: RRR Abd: + BS, NTND Ext:       Right Lower Extremity              Ex fix stable             pinsite dressings c/d/i             prevena functioning well, no drainage             Swelling controlled             EHL, FHL, lesser toe motor functions itnact              No pain with passive stretch              Ext warm              Brisk cap refill             Good distal perfusion   Assessment/Plan: 1 Day Post-Op   Active Problems:   Tibial fracture   Closed right pilon fracture   Current nicotine use   Talus fracture, right lateral talar process   Closed right fibular fracture              Anti-infectives (From admission, onward)   Start     Dose/Rate Route Frequency Ordered Stop   05/21/18 1800  ceFAZolin (ANCEF) IVPB 1 g/50 mL premix     1 g 100 mL/hr over 30 Minutes Intravenous Every 6 hours 05/21/18 1706 05/22/18 0622   05/21/18 0600  ceFAZolin (ANCEF) IVPB 2g/100 mL premix     2 g 200 mL/hr over 30 Minutes Intravenous On call to O.R. 05/21/18 7124 05/21/18 1225    .  POD/HD#: 1  48 y/o male s/p fall off ladder with closed R pilon fracture and closed R tibial shaft fracture   - fall off ladder  - closed comminuted R pilon, tibial shaft and fibula fractures s/p ORIF with retention of Ex fix  NWB R leg Aggressive ice and elevation of R extremity  Prevena dressing in place over incisions, we will remove at office follow up on 05/29/2018             Ok to start pin care and removing ace/webril on 05/24/2018             Toe motion as tolerated                -  Pain management: norco  Robaxin  - ABL anemia/Hemodynamics Stable  - Medical issues Nicotine use Discussed negative effects of nicotine on bone and wound healing. It can also contribute to prolonged swelling   - DVT/PE prophylaxis: Lovenox x 2 more weeks  - ID: periop abx completed  - Metabolic Bone Disease: mild vitamin d insufficiency                          Vitamin d and vitamin c started at last admission   - Activity: Activity as tolerated while maintaining NWB R leg  - FEN/GI prophylaxis/Foley/Lines: Reg diet dc foley                -  Ex-fix/Splint care:  pin care starting on 05/24/2018  - Impediments to fracture healing: High energy articular injury Significant soft tissue injury  Nicotine use   - Dispo: Dc home today after foley dc'd and pt voids and after he works with therapies Follow up with ortho in 1 week for dressing change    Disposition:    Allergies as of 05/22/2018   No Known Allergies     Medication List    TAKE these medications   acetaminophen 500 MG tablet Commonly known as:  TYLENOL Take 1 tablet (500 mg total) by mouth every 12 (twelve) hours. What changed:    when to take this  reasons to take this   ascorbic acid 1000 MG tablet Commonly known as:  VITAMIN C Take 1 tablet (1,000 mg total) by mouth daily.   docusate sodium 100 MG capsule Commonly known as:  COLACE Take 1 capsule (100 mg total) by mouth 2 (two) times daily.   enoxaparin 40 MG/0.4ML injection Commonly known as:  LOVENOX Inject 0.4 mLs (40 mg total) into the skin daily. Take remaining lovenox injections that you have at home from previous prescription   fexofenadine 180 MG tablet Commonly known as:  ALLEGRA Take 180 mg by mouth daily.    gabapentin 300 MG capsule Commonly known as:  NEURONTIN Take 1 capsule (300 mg total) by mouth 2 (two) times daily for 14 days.   HYDROcodone-acetaminophen 7.5-325 MG tablet Commonly known as:  NORCO Take 1-2 tablets by mouth every 6 (six) hours as needed for moderate pain or severe pain.   methocarbamol 750 MG tablet Commonly known as:  Robaxin-750 Take 1 tablet (750 mg total) by mouth every 8 (eight) hours as needed for muscle spasms.   multivitamin with minerals tablet Take 1 tablet by mouth daily.   Vitamin D 125 MCG (5000 UT) Caps Take 1 capsule by mouth daily. What changed:  how much to take        Discharge Instructions and Plan:  48 y/o male s/p fall off ladder with closed R pilon fracture and closed R tibial shaft fracture   - fall off ladder  - closed comminuted R pilon, tibial shaft and fibula fractures s/p ORIF with retention of Ex fix  NWB R leg Aggressive ice and elevation of R extremity  Prevena dressing in place over incisions, we will remove at office follow up on 05/29/2018             Ok to start pin care and removing ace/webril on 05/24/2018             Toe motion as tolerated                - Pain management: norco  Robaxin  - Medical issues Nicotine use Discussed negative effects of nicotine on bone and wound healing. It can also contribute to prolonged swelling   - DVT/PE prophylaxis: Lovenox x 2 more weeks   - Metabolic Bone Disease: mild vitamin d insufficiency                          Vitamin d and vitamin c started at last admission   - Activity: Activity as tolerated while maintaining NWB R leg  - FEN/GI prophylaxis/Foley/Lines: Reg diet               -Ex-fix/Splint care:  pin care starting on 05/24/2018  - Impediments to fracture healing: High energy  articular injury  Significant soft tissue injury  Nicotine use   - Dispo: Dc home today after foley dc'd and pt voids and after he works with therapies Follow up with ortho in 1 week for dressing change    Signed:  Mearl Latin, PA-C [phone] (C) 05/22/2018, 9:06 AM  [facility] [address] [place] [address] [phone] Collier Bullock (F)

## 2018-05-23 ENCOUNTER — Encounter (HOSPITAL_COMMUNITY): Payer: Self-pay | Admitting: Orthopedic Surgery

## 2018-06-05 ENCOUNTER — Encounter (HOSPITAL_COMMUNITY): Payer: Self-pay | Admitting: Orthopedic Surgery

## 2018-06-05 DIAGNOSIS — E559 Vitamin D deficiency, unspecified: Secondary | ICD-10-CM

## 2018-06-05 HISTORY — DX: Vitamin D deficiency, unspecified: E55.9

## 2018-06-27 ENCOUNTER — Other Ambulatory Visit: Payer: Self-pay

## 2018-06-27 ENCOUNTER — Other Ambulatory Visit (HOSPITAL_COMMUNITY)
Admission: RE | Admit: 2018-06-27 | Discharge: 2018-06-27 | Disposition: A | Discharge status: Home / Self Care | Payer: BC Managed Care – PPO | Source: Ambulatory Visit | Attending: Orthopedic Surgery | Admitting: Orthopedic Surgery

## 2018-06-27 ENCOUNTER — Encounter (HOSPITAL_COMMUNITY): Payer: Self-pay | Admitting: *Deleted

## 2018-06-27 DIAGNOSIS — Z1159 Encounter for screening for other viral diseases: Secondary | ICD-10-CM | POA: Diagnosis not present

## 2018-06-27 NOTE — Anesthesia Preprocedure Evaluation (Addendum)
Anesthesia Evaluation  Patient identified by MRN, date of birth, ID band Patient awake    Reviewed: Allergy & Precautions, NPO status , Patient's Chart, lab work & pertinent test results  Airway Mallampati: II  TM Distance: >3 FB Neck ROM: Full    Dental no notable dental hx. (+) Teeth Intact, Dental Advisory Given   Pulmonary neg pulmonary ROS, former smoker,    Pulmonary exam normal breath sounds clear to auscultation       Cardiovascular negative cardio ROS Normal cardiovascular exam Rhythm:Regular Rate:Normal     Neuro/Psych negative neurological ROS  negative psych ROS   GI/Hepatic negative GI ROS, Neg liver ROS,   Endo/Other  negative endocrine ROS  Renal/GU negative Renal ROS  negative genitourinary   Musculoskeletal negative musculoskeletal ROS (+)   Abdominal   Peds  Hematology negative hematology ROS (+)   Anesthesia Other Findings   Reproductive/Obstetrics                            Anesthesia Physical Anesthesia Plan  ASA: II  Anesthesia Plan: General   Post-op Pain Management:    Induction: Intravenous  PONV Risk Score and Plan: 2 and Ondansetron, Dexamethasone and Midazolam  Airway Management Planned: Oral ETT and LMA  Additional Equipment:   Intra-op Plan:   Post-operative Plan: Extubation in OR  Informed Consent: I have reviewed the patients History and Physical, chart, labs and discussed the procedure including the risks, benefits and alternatives for the proposed anesthesia with the patient or authorized representative who has indicated his/her understanding and acceptance.     Dental advisory given  Plan Discussed with: CRNA  Anesthesia Plan Comments:         Anesthesia Quick Evaluation

## 2018-06-27 NOTE — Progress Notes (Signed)
Spoke with pt for pre-op call. Pt denies cardiac history, HTN or Diabetes.  Pt had Covid test done today.   Coronavirus Screening  Have you experienced the following symptoms:  Cough NO Fever (>100.24F) NO Runny nose NO Sore throat NO Difficulty breathing/shortness of breath  NO  Have you or a family member traveled in the last 14 days and where? NO    Patient reminded that hospital visitation restrictions are in effect and the importance of the restrictions.

## 2018-06-27 NOTE — Progress Notes (Signed)
Covid 19 screen scheduled 6/18 for 6/19 procedure at [facility]

## 2018-06-28 ENCOUNTER — Ambulatory Visit (HOSPITAL_COMMUNITY): Payer: BC Managed Care – PPO | Admitting: Anesthesiology

## 2018-06-28 ENCOUNTER — Encounter (HOSPITAL_COMMUNITY): Payer: Self-pay

## 2018-06-28 ENCOUNTER — Encounter (HOSPITAL_COMMUNITY)
Admission: RE | Disposition: A | Discharge status: Home / Self Care | Payer: Self-pay | Source: Home / Self Care | Attending: Orthopedic Surgery

## 2018-06-28 ENCOUNTER — Ambulatory Visit (HOSPITAL_COMMUNITY)
Admission: RE | Admit: 2018-06-28 | Discharge: 2018-06-28 | Disposition: A | Discharge status: Home / Self Care | Payer: BC Managed Care – PPO | Attending: Orthopedic Surgery | Admitting: Orthopedic Surgery

## 2018-06-28 ENCOUNTER — Other Ambulatory Visit: Payer: Self-pay

## 2018-06-28 DIAGNOSIS — T8489XA Other specified complication of internal orthopedic prosthetic devices, implants and grafts, initial encounter: Secondary | ICD-10-CM | POA: Insufficient documentation

## 2018-06-28 DIAGNOSIS — Y831 Surgical operation with implant of artificial internal device as the cause of abnormal reaction of the patient, or of later complication, without mention of misadventure at the time of the procedure: Secondary | ICD-10-CM | POA: Diagnosis not present

## 2018-06-28 DIAGNOSIS — F17200 Nicotine dependence, unspecified, uncomplicated: Secondary | ICD-10-CM | POA: Diagnosis not present

## 2018-06-28 HISTORY — PX: EXTERNAL FIXATION REMOVAL: SHX5040

## 2018-06-28 LAB — BASIC METABOLIC PANEL
Anion gap: 13 (ref 5–15)
BUN: 8 mg/dL (ref 6–20)
CO2: 22 mmol/L (ref 22–32)
Calcium: 9.3 mg/dL (ref 8.9–10.3)
Chloride: 107 mmol/L (ref 98–111)
Creatinine, Ser: 0.53 mg/dL — ABNORMAL LOW (ref 0.61–1.24)
GFR calc Af Amer: >60 mL/min (ref >60)
GFR calc non Af Amer: >60 mL/min (ref >60)
Glucose, Bld: 98 mg/dL (ref 70–99)
Potassium: 3.6 mmol/L (ref 3.5–5.1)
Sodium: 142 mmol/L (ref 135–145)

## 2018-06-28 LAB — CBC
HCT: 39.8 % (ref 39.0–52.0)
Hemoglobin: 13 g/dL (ref 13.0–17.0)
MCH: 31.4 pg (ref 26.0–34.0)
MCHC: 32.7 g/dL (ref 30.0–36.0)
MCV: 96.1 fL (ref 80.0–100.0)
Platelets: 349 10*3/uL (ref 150–400)
RBC: 4.14 MIL/uL — ABNORMAL LOW (ref 4.22–5.81)
RDW: 13.5 % (ref 11.5–15.5)
WBC: 6 10*3/uL (ref 4.0–10.5)
nRBC: 0 % (ref 0.0–0.2)

## 2018-06-28 LAB — SARS CORONAVIRUS 2 (TAT 6-24 HRS): SARS Coronavirus 2: NEGATIVE

## 2018-06-28 SURGERY — REMOVAL, EXTERNAL FIXATION DEVICE, LOWER EXTREMITY
Anesthesia: General | Laterality: Right

## 2018-06-28 MED ORDER — PROPOFOL 10 MG/ML IV BOLUS
INTRAVENOUS | Status: AC
  Filled 2018-06-28: qty 40

## 2018-06-28 MED ORDER — OXYCODONE HCL 5 MG PO TABS
ORAL_TABLET | ORAL | Status: AC
  Filled 2018-06-28: qty 1

## 2018-06-28 MED ORDER — LIDOCAINE 2% (20 MG/ML) 5 ML SYRINGE
INTRAMUSCULAR | Status: DC | PRN
  Administered 2018-06-28: 100 mg via INTRAVENOUS

## 2018-06-28 MED ORDER — CEFAZOLIN SODIUM 1 G IJ SOLR
INTRAMUSCULAR | Status: AC
  Filled 2018-06-28: qty 20

## 2018-06-28 MED ORDER — FENTANYL CITRATE (PF) 100 MCG/2ML IJ SOLN: 25.0000 ug | INTRAMUSCULAR | Status: DC | PRN

## 2018-06-28 MED ORDER — DEXAMETHASONE SODIUM PHOSPHATE 10 MG/ML IJ SOLN
INTRAMUSCULAR | Status: AC
  Filled 2018-06-28: qty 1

## 2018-06-28 MED ORDER — ACETAMINOPHEN 500 MG PO TABS
1000.0000 mg | ORAL_TABLET | Freq: Once | ORAL | Status: DC
  Filled 2018-06-28: qty 2

## 2018-06-28 MED ORDER — FENTANYL CITRATE (PF) 100 MCG/2ML IJ SOLN
INTRAMUSCULAR | Status: DC | PRN
  Administered 2018-06-28: 100 ug via INTRAVENOUS
  Administered 2018-06-28: 50 ug via INTRAVENOUS

## 2018-06-28 MED ORDER — CEFAZOLIN SODIUM-DEXTROSE 2-3 GM-%(50ML) IV SOLR
INTRAVENOUS | Status: DC | PRN
  Administered 2018-06-28: 2 g via INTRAVENOUS

## 2018-06-28 MED ORDER — LACTATED RINGERS IV SOLN
INTRAVENOUS | Status: DC
  Administered 2018-06-28 (×2): via INTRAVENOUS

## 2018-06-28 MED ORDER — ACETAMINOPHEN 500 MG PO TABS
ORAL_TABLET | ORAL | Status: AC
  Filled 2018-06-28: qty 1

## 2018-06-28 MED ORDER — DEXAMETHASONE SODIUM PHOSPHATE 10 MG/ML IJ SOLN
INTRAMUSCULAR | Status: DC | PRN
  Administered 2018-06-28: 5 mg via INTRAVENOUS

## 2018-06-28 MED ORDER — FENTANYL CITRATE (PF) 250 MCG/5ML IJ SOLN
INTRAMUSCULAR | Status: AC
  Filled 2018-06-28: qty 5

## 2018-06-28 MED ORDER — PROPOFOL 10 MG/ML IV BOLUS
INTRAVENOUS | Status: DC | PRN
  Administered 2018-06-28: 200 mg via INTRAVENOUS

## 2018-06-28 MED ORDER — LIDOCAINE 2% (20 MG/ML) 5 ML SYRINGE
INTRAMUSCULAR | Status: AC
  Filled 2018-06-28: qty 5

## 2018-06-28 MED ORDER — ONDANSETRON HCL 4 MG/2ML IJ SOLN
INTRAMUSCULAR | Status: AC
  Filled 2018-06-28: qty 2

## 2018-06-28 MED ORDER — MIDAZOLAM HCL 2 MG/2ML IJ SOLN
INTRAMUSCULAR | Status: AC
  Filled 2018-06-28: qty 2

## 2018-06-28 MED ORDER — 0.9 % SODIUM CHLORIDE (POUR BTL) OPTIME
TOPICAL | Status: DC | PRN
  Administered 2018-06-28: 1000 mL

## 2018-06-28 MED ORDER — MIDAZOLAM HCL 5 MG/5ML IJ SOLN
INTRAMUSCULAR | Status: DC | PRN
  Administered 2018-06-28: 2 mg via INTRAVENOUS

## 2018-06-28 MED ORDER — OXYCODONE HCL 5 MG PO TABS
5.0000 mg | ORAL_TABLET | Freq: Once | ORAL | Status: AC
  Administered 2018-06-28: 5 mg via ORAL

## 2018-06-28 MED ORDER — ONDANSETRON HCL 4 MG/2ML IJ SOLN
INTRAMUSCULAR | Status: DC | PRN
  Administered 2018-06-28: 4 mg via INTRAVENOUS

## 2018-06-28 MED ORDER — ACETAMINOPHEN 500 MG PO TABS
500.0000 mg | ORAL_TABLET | Freq: Once | ORAL | Status: AC
  Administered 2018-06-28: 07:00:00 500 mg via ORAL

## 2018-06-28 SURGICAL SUPPLY — 44 items
BANDAGE ACE 4X5 VEL STRL LF (GAUZE/BANDAGES/DRESSINGS) ×3 IMPLANT
BANDAGE ACE 6X5 VEL STRL LF (GAUZE/BANDAGES/DRESSINGS) ×2 IMPLANT
BANDAGE ELASTIC 4 VELCRO ST LF (GAUZE/BANDAGES/DRESSINGS) ×3 IMPLANT
BANDAGE ELASTIC 6 VELCRO ST LF (GAUZE/BANDAGES/DRESSINGS) ×3 IMPLANT
BNDG GAUZE ELAST 4 BULKY (GAUZE/BANDAGES/DRESSINGS) ×3 IMPLANT
BRUSH SCRUB SURG 4.25 DISP (MISCELLANEOUS) ×6 IMPLANT
COVER SURGICAL LIGHT HANDLE (MISCELLANEOUS) ×6 IMPLANT
COVER WAND RF STERILE (DRAPES) ×3 IMPLANT
DRAPE C-ARM 42X72 X-RAY (DRAPES) IMPLANT
DRAPE C-ARMOR (DRAPES) ×3 IMPLANT
DRAPE U-SHAPE 47X51 STRL (DRAPES) ×3 IMPLANT
DRSG ADAPTIC 3X8 NADH LF (GAUZE/BANDAGES/DRESSINGS) ×3 IMPLANT
DRSG MEPITEL 4X7.2 (GAUZE/BANDAGES/DRESSINGS) ×3 IMPLANT
ELECT REM PT RETURN 9FT ADLT (ELECTROSURGICAL) ×3
ELECTRODE REM PT RTRN 9FT ADLT (ELECTROSURGICAL) ×1 IMPLANT
GAUZE SPONGE 4X4 12PLY STRL (GAUZE/BANDAGES/DRESSINGS) ×3 IMPLANT
GLOVE BIO SURGEON STRL SZ7.5 (GLOVE) ×3 IMPLANT
GLOVE BIO SURGEON STRL SZ8 (GLOVE) ×3 IMPLANT
GLOVE BIOGEL PI IND STRL 7.5 (GLOVE) ×1 IMPLANT
GLOVE BIOGEL PI IND STRL 8 (GLOVE) ×1 IMPLANT
GLOVE BIOGEL PI INDICATOR 7.5 (GLOVE) ×2
GLOVE BIOGEL PI INDICATOR 8 (GLOVE) ×2
GOWN STRL REUS W/ TWL LRG LVL3 (GOWN DISPOSABLE) ×2 IMPLANT
GOWN STRL REUS W/ TWL XL LVL3 (GOWN DISPOSABLE) ×1 IMPLANT
GOWN STRL REUS W/TWL LRG LVL3 (GOWN DISPOSABLE) ×4
GOWN STRL REUS W/TWL XL LVL3 (GOWN DISPOSABLE) ×2
KIT BASIN OR (CUSTOM PROCEDURE TRAY) ×3 IMPLANT
KIT TURNOVER KIT B (KITS) ×3 IMPLANT
MANIFOLD NEPTUNE II (INSTRUMENTS) ×3 IMPLANT
NS IRRIG 1000ML POUR BTL (IV SOLUTION) ×3 IMPLANT
PACK ORTHO EXTREMITY (CUSTOM PROCEDURE TRAY) ×3 IMPLANT
PAD ABD 8X10 STRL (GAUZE/BANDAGES/DRESSINGS) ×3 IMPLANT
PAD ARMBOARD 7.5X6 YLW CONV (MISCELLANEOUS) ×6 IMPLANT
PAD CAST 4YDX4 CTTN HI CHSV (CAST SUPPLIES) ×1 IMPLANT
PADDING CAST COTTON 4X4 STRL (CAST SUPPLIES) ×2
PADDING CAST COTTON 6X4 STRL (CAST SUPPLIES) ×3 IMPLANT
SPLINT PLASTER CAST XFAST 5X30 (CAST SUPPLIES) ×1 IMPLANT
SPLINT PLASTER XFAST SET 5X30 (CAST SUPPLIES) ×2
SPONGE LAP 18X18 RF (DISPOSABLE) ×3 IMPLANT
STAPLER VISISTAT 35W (STAPLE) IMPLANT
TOWEL GREEN STERILE FF (TOWEL DISPOSABLE) ×6 IMPLANT
TOWEL OR 17X26 10 PK STRL BLUE (TOWEL DISPOSABLE) ×6 IMPLANT
UNDERPAD 30X30 (UNDERPADS AND DIAPERS) ×3 IMPLANT
WATER STERILE IRR 1000ML POUR (IV SOLUTION) ×6 IMPLANT

## 2018-06-28 NOTE — Transfer of Care (Signed)
Immediate Anesthesia Transfer of Care Note  Patient: [name] [name]  Procedure(s) Performed: REMOVAL EXTERNAL FIXATION  AND CURETTAGE OF ULCERATED PIN SITES (Right )  Patient Location: PACU  Anesthesia Type:General  Level of Consciousness: awake, alert , oriented and sedated  Airway & Oxygen Therapy: Patient Spontanous Breathing and Patient connected to nasal cannula oxygen  Post-op Assessment: Report given to RN, Post -op Vital signs reviewed and stable and Patient moving all extremities  Post vital signs: Reviewed and stable  Last Vitals:  Vitals Value Taken Time  BP 122/85 06/28/18 0909  Temp    Pulse 68 06/28/18 0912  Resp 12 06/28/18 0912  SpO2 100 % 06/28/18 0912  Vitals shown include unvalidated device data.  Last Pain:  Vitals:   06/28/18 0634  PainSc: 0-No pain         Complications: No apparent anesthesia complications

## 2018-06-28 NOTE — Progress Notes (Signed)
Pt stated he took 500mg  of Tylenol this morning, DOS.  Giving 500mg  this morning in short stay, per verbal order by Dr. Lanetta Inch.

## 2018-06-28 NOTE — Discharge Instructions (Addendum)
Orthopaedic Trauma Service Discharge Instructions   General Discharge Instructions  WEIGHT BEARING STATUS: Nonweightbearing Right Leg  RANGE OF MOTION/ACTIVITY: ok to move toes and knee  Wound Care: do not get splint wet, keep clean and dry, do not remove splint    Diet: as you were eating previously.  Can use over the counter stool softeners and bowel preparations, such as Miralax, to help with bowel movements.  Narcotics can be constipating.  Be sure to drink plenty of fluids  PAIN MEDICATION USE AND EXPECTATIONS  You have likely been given narcotic medications to help control your pain.  After a traumatic event that results in an fracture (broken bone) with or without surgery, it is ok to use narcotic pain medications to help control one's pain.  We understand that everyone responds to pain differently and each individual patient will be evaluated on a regular basis for the continued need for narcotic medications. Ideally, narcotic medication use should last no more than 6-8 weeks (coinciding with fracture healing).   As a patient it is your responsibility as well to monitor narcotic medication use and report the amount and frequency you use these medications when you come to your office visit.   We would also advise that if you are using narcotic medications, you should take a dose prior to therapy to maximize you participation.  IF YOU ARE ON NARCOTIC MEDICATIONS IT IS NOT PERMISSIBLE TO OPERATE A MOTOR VEHICLE (MOTORCYCLE/CAR/TRUCK/MOPED) OR HEAVY MACHINERY DO NOT MIX NARCOTICS WITH OTHER CNS (CENTRAL NERVOUS SYSTEM) DEPRESSANTS SUCH AS ALCOHOL   STOP SMOKING OR USING NICOTINE PRODUCTS!!!!  As discussed nicotine severely impairs your body's ability to heal surgical and traumatic wounds but also impairs bone healing.  Wounds and bone heal by forming microscopic blood vessels (angiogenesis) and nicotine is a vasoconstrictor (essentially, shrinks blood vessels).  Therefore, if  vasoconstriction occurs to these microscopic blood vessels they essentially disappear and are unable to deliver necessary nutrients to the healing tissue.  This is one modifiable factor that you can do to dramatically increase your chances of healing your injury.    (This means no smoking, no nicotine gum, patches, etc)  DO NOT USE NONSTEROIDAL ANTI-INFLAMMATORY DRUGS (NSAID'S)  Using products such as Advil (ibuprofen), Aleve (naproxen), Motrin (ibuprofen) for additional pain control during fracture healing can delay and/or prevent the healing response.  If you would like to take over the counter (OTC) medication, Tylenol (acetaminophen) is ok.  However, some narcotic medications that are given for pain control contain acetaminophen as well. Therefore, you should not exceed more than 4000 mg of tylenol in a day if you do not have liver disease.  Also note that there are may OTC medicines, such as cold medicines and allergy medicines that my contain tylenol as well.  If you have any questions about medications and/or interactions please ask your doctor/PA or your pharmacist.      ICE AND ELEVATE INJURED/OPERATIVE EXTREMITY  Using ice and elevating the injured extremity above your heart can help with swelling and pain control.  Icing in a pulsatile fashion, such as 20 minutes on and 20 minutes off, can be followed.    Do not place ice directly on skin. Make sure there is a barrier between to skin and the ice pack.    Using frozen items such as frozen peas works well as the conform nicely to the are that needs to be iced.  USE AN ACE WRAP OR TED HOSE FOR SWELLING CONTROL  In addition to icing and elevation, Ace wraps or TED hose are used to help limit and resolve swelling.  It is recommended to use Ace wraps or TED hose until you are informed to stop.    When using Ace Wraps start the wrapping distally (farthest away from the body) and wrap proximally (closer to the body)   Example: If you had surgery on  your leg or thing and you do not have a splint on, start the ace wrap at the toes and work your way up to the thigh        If you had surgery on your upper extremity and do not have a splint on, start the ace wrap at your fingers and work your way up to the upper arm  IF YOU ARE IN A SPLINT OR CAST DO NOT REMOVE IT FOR ANY REASON   If your splint gets wet for any reason please contact the office immediately. You may shower in your splint or cast as long as you keep it dry.  This can be done by wrapping in a cast cover or garbage back (or similar)  Do Not stick any thing down your splint or cast such as pencils, money, or hangers to try and scratch yourself with.  If you feel itchy take benadryl as prescribed on the bottle for itching  IF YOU ARE IN A CAM BOOT (BLACK BOOT)  You may remove boot periodically. Perform daily dressing changes as noted below.  Wash the liner of the boot regularly and wear a sock when wearing the boot. It is recommended that you sleep in the boot until told otherwise  CALL THE OFFICE WITH ANY QUESTIONS OR CONCERNS: [phone]

## 2018-06-28 NOTE — H&P (Signed)
H&P documentation: 05/21/18  -History and Physical Reviewed  -Patient has been re-examined  -No change in the plan of care  RLE Dressing intact, clean, dry; ulcerated pin sites  Edema/ swelling controlled  Sens: DPN, SPN, TN intact  Motor: EHL, FHL, and lessor toe ext and flex all intact grossly  Brisk cap refill, warm to touch  I discussed with the patient the risks and benefits of surgery for his right ankle, including the possibility of failure to resolve infection, nerve injury, vessel injury, wound breakdown, arthritis, symptomatic hardware, DVT/ PE, loss of motion, malunion, nonunion, loss of reduction, and need for further surgery among others. He acknowledged these risks and wished to proceed.    Rozanna Box

## 2018-06-28 NOTE — Anesthesia Postprocedure Evaluation (Signed)
Anesthesia Post Note  Patient: Brad Lieurance  Procedure(s) Performed: REMOVAL EXTERNAL FIXATION  AND CURETTAGE OF ULCERATED PIN SITES (Right )     Patient location during evaluation: PACU Anesthesia Type: General Level of consciousness: awake and alert Pain management: pain level controlled Vital Signs Assessment: post-procedure vital signs reviewed and stable Respiratory status: spontaneous breathing, nonlabored ventilation, respiratory function stable and patient connected to nasal cannula oxygen Cardiovascular status: blood pressure returned to baseline and stable Postop Assessment: no apparent nausea or vomiting Anesthetic complications: no    Last Vitals:  Vitals:   06/28/18 0920 06/28/18 0938  BP: 118/75 (!) 122/94  Pulse: 72 73  Resp: 20 16  Temp: (!) 36.3 C   SpO2: 98% 98%    Last Pain:  Vitals:   06/28/18 0938  PainSc: 0-No pain                 Erskin Zinda L Zaylin Runco

## 2018-06-28 NOTE — Anesthesia Procedure Notes (Signed)
Procedure Name: LMA Insertion Date/Time: 06/28/2018 8:27 AM Performed by: Scheryl Darter, CRNA Pre-anesthesia Checklist: Patient identified, Emergency Drugs available, Suction available and Patient being monitored Patient Re-evaluated:Patient Re-evaluated prior to induction Oxygen Delivery Method: Circle System Utilized Preoxygenation: Pre-oxygenation with 100% oxygen Induction Type: IV induction Ventilation: Mask ventilation without difficulty LMA: LMA inserted LMA Size: 4.0 Number of attempts: 1 Airway Equipment and Method: Bite block Placement Confirmation: positive ETCO2 Tube secured with: Tape Dental Injury: Teeth and Oropharynx as per pre-operative assessment

## 2018-06-28 NOTE — Brief Op Note (Signed)
06/28/2018  12:54 PM  PATIENT:  [name] [name]  48 y.o. male  PRE-OPERATIVE DIAGNOSIS:   1. RIGHT ANKLE PILON FRACTURE S/P ORIF 2. RETAINED EXTERNAL FIXATOR 3. ULCERATED PIN SITES LEG AND HEEL  POST-OPERATIVE DIAGNOSIS:   1. RIGHT ANKLE PILON FRACTURE S/P ORIF 2. RETAINED EXTERNAL FIXATOR 3. ULCERATED PIN SITES LEG AND HEEL  PROCEDURES:  Procedure(s): 1. REMOVAL OF EXTERNAL FIXATOR UNDER ANESTHESIA 2. CURETTAGE OF ULCERATED PIN SITES TIBIA (Right) AND CALCANEUS AND METATARSALS 3. APPLICATION OF LONG SPLINT  SURGEON:  Surgeon(s) and Role:    [name] Pulaski, MD - Primary  PHYSICIAN ASSISTANT: Student  ANESTHESIA:   general  EBL:  5 mL   BLOOD ADMINISTERED:none  DRAINS: none   LOCAL MEDICATIONS USED:  NONE  SPECIMEN:  No Specimen  DISPOSITION OF SPECIMEN:  N/A  COUNTS:  YES  TOURNIQUET:  * No tourniquets in log *  DICTATION: .Note written in EPIC  PLAN OF CARE: Discharge to home after PACU  PATIENT DISPOSITION:  PACU - hemodynamically stable.   Delay start of Pharmacological VTE agent (>24hrs) due to surgical blood loss or risk of bleeding: no  INDICATIONS: Patient is s/p external fixation of severe pilon fracture that allowed only for minifragment fixation at the time of ORIF on 05/21/18. He has developed ulcerations of the pin sites which favor proceeding with removal of the fixator at this time and conversion into a splint. I discussed with the patient the risks and benefits of surgery, including the possibility of infection, nerve injury, vessel injury, wound breakdown, arthritis, symptomatic hardware, DVT/ PE, loss of motion, malunion, nonunion, and need for further surgery among others.  He acknowledged these risks and wished to proceed.  BRIEF SUMMARY OF PROCEDURE: After administration of preoperative antibiotics the patient was taken to the operating room and general anesthesia induced. A time out was held. The fixator clamps were then loosened and the pins  removed, followed by a thorough scrubbing with chlorhexidine and wash. The pin sites, which had ulcerated at the leg and heel around the pins and to a lesser extent at the metatarsals, were then debrided with curettes at the skin, subcutaneous tissues, muscle fascia layers, as well as the near and far bone cortices, removing all discernible devitalized necrotic tissue, bone debris, and desiccated fibrinous material. The calcaneal pin tract was debrided with curettage all the way through the bone canal to the opposite side of the calcaneus. This canal and all pin sites were thoroughly irrigated with saline.  The wounds were then dressed with adaptic, gauze, softly compressive dressing followed by a posterior and stirrup splint. [name] Spinner, PA-C, was present and assisting throughout.  PROGNOSIS: Patient will be NWB in a splint until follow up in the office in 10 days at which time we anticipate transitioning into a CAM boot and beginning AROM with possible TDWB depending on examination and x-rays. No formal DVT prophylaxis is required given anticipated immediate mobilization and time from initial surgery and injury.  [name] [place], MD [facility], [facility] [phone]

## 2018-06-28 NOTE — Op Note (Signed)
06/28/2018  12:54 PM  PATIENT:  [name] [name] Age  48 y.o. male  PRE-OPERATIVE DIAGNOSIS:   1. RIGHT ANKLE PILON FRACTURE S/P ORIF 2. RETAINED EXTERNAL FIXATOR 3. ULCERATED PIN SITES LEG AND HEEL  POST-OPERATIVE DIAGNOSIS:   1. RIGHT ANKLE PILON FRACTURE S/P ORIF 2. RETAINED EXTERNAL FIXATOR 3. ULCERATED PIN SITES LEG AND HEEL  PROCEDURES:  Procedure(s): 1. REMOVAL OF EXTERNAL FIXATOR UNDER ANESTHESIA 2. CURETTAGE OF ULCERATED PIN SITES TIBIA (Right) AND CALCANEUS AND METATARSALS 3. APPLICATION OF LONG SPLINT  SURGEON:  Surgeon(s) and Role:    * [name] Insco, MD - Primary  PHYSICIAN ASSISTANT: Student  ANESTHESIA:   general  EBL:  5 mL   BLOOD ADMINISTERED:none  DRAINS: none   LOCAL MEDICATIONS USED:  NONE  SPECIMEN:  No Specimen  DISPOSITION OF SPECIMEN:  N/A  COUNTS:  YES  TOURNIQUET:  * No tourniquets in log *  DICTATION: .Note written in EPIC  PLAN OF CARE: Discharge to home after PACU  PATIENT DISPOSITION:  PACU - hemodynamically stable.   Delay start of Pharmacological VTE agent (>24hrs) due to surgical blood loss or risk of bleeding: no  INDICATIONS: Patient is s/p external fixation of severe pilon fracture that allowed only for minifragment fixation at the time of ORIF on 05/21/18. He has developed ulcerations of the pin sites which favor proceeding with removal of the fixator at this time and conversion into a splint. I discussed with the patient the risks and benefits of surgery, including the possibility of infection, nerve injury, vessel injury, wound breakdown, arthritis, symptomatic hardware, DVT/ PE, loss of motion, malunion, nonunion, and need for further surgery among others.  He acknowledged these risks and wished to proceed.  BRIEF SUMMARY OF PROCEDURE: After administration of preoperative antibiotics the patient was taken to the operating room and general anesthesia induced. A time out was held. The fixator clamps were then loosened and the pins  removed, followed by a thorough scrubbing with chlorhexidine and wash. The pin sites, which had ulcerated at the leg and heel around the pins and to a lesser extent at the metatarsals, were then debrided with curettes at the skin, subcutaneous tissues, muscle fascia layers, as well as the near and far bone cortices, removing all discernible devitalized necrotic tissue, bone debris, and desiccated fibrinous material. The calcaneal pin tract was debrided with curettage all the way through the bone canal to the opposite side of the calcaneus. This canal and all pin sites were thoroughly irrigated with saline.  The wounds were then dressed with adaptic, gauze, softly compressive dressing followed by a posterior and stirrup splint. [name] Paul, PA-C, was present and assisting throughout.  PROGNOSIS: Patient will be NWB in a splint until follow up in the office in 10 days at which time we anticipate transitioning into a CAM boot and beginning AROM with possible TDWB depending on examination and x-rays. No formal DVT prophylaxis is required given anticipated immediate mobilization and time from initial surgery and injury.  [name] Schlosser, MD [facility] [phone]   

## 2018-06-29 ENCOUNTER — Encounter (HOSPITAL_COMMUNITY): Payer: Self-pay | Admitting: Orthopedic Surgery

## 2018-07-19 NOTE — H&P (Signed)
Orthopaedic Trauma Service H&P  Reason for Consult: Right tibial pilon and talar body fracture Referring Physician: Shirlyn Goltz, MD  [name] [name] is an 48 y.o. male.  HPI: [place] from ladder over 6 ft onto concrete driveway, underwent ex-fix and then delayed ORIF but required retention of fixator. Now presents for removal and curettage of pin sites.  History reviewed. No pertinent past medical history.  History reviewed. No pertinent surgical history.  History reviewed. No pertinent family history.  Social History:  reports that he has been smoking. He has never used smokeless tobacco. He reports current alcohol use. He reports that he does not use drugs.  Allergies: No Known Allergies  Medications:  Prior to Admission:  No medications prior to admission.      ROS No recent fever, bleeding abnormalities, urologic dysfunction, GI problems, or weight gain.  AFVSS, height 5\' 8"  (1.727 m), weight 66.2 kg Physical Exam NCAT RRR CTA S/NT/ND RLE Pin sites with min drainage but ulcerated  Swelling well controlled  Intact sens DPN, SPN, TN  F/E great and lesser toes intact  DP2+, incisions well healed  Assessment/Plan: Right tibia pilon, tibia and fibula and posterolateral talus fracture for immediate s/p remote ex-fix and more redent ORIF on 05/21/18 for ex-fix removal and curettage of pin sites.   I discussed with the patient the risks and benefits of surgery, including the possibility of infection, nerve injury, vessel injury, wound breakdown, arthritis, symptomatic hardware, DVT/ PE, loss of motion, loss of reduction, malunion, nonunion, and need for further surgery among others. He acknowledged these risks and wished to proceed.  Altamese Bienville, MD [facility], [facility] [phone]

## 2019-07-02 IMAGING — CT 3-DIMENSIONAL CT IMAGE RENDERING ON ACQUISITION WORKSTATION
1 series · 1 of 3 positions shown · non-contrast
Comparison: Right ankle x-rays dated April 27, 2018.

CLINICAL DATA: Distal tibia/fibular fractures.

EXAM:
3-DIMENSIONAL CT IMAGE RENDERING ON ACQUISITION WORKSTATION
TECHNIQUE: 3-dimensional CT images were rendered by post-processing of the
original CT data on an acquisition workstation. The 3-dimensional CT
images were interpreted and findings were reported in the
accompanying complete CT report for this study.

[Series 241: 3d tumbles right ankle · 0.80mm/px · 1 of 3 slices shown]
[im 2/3]
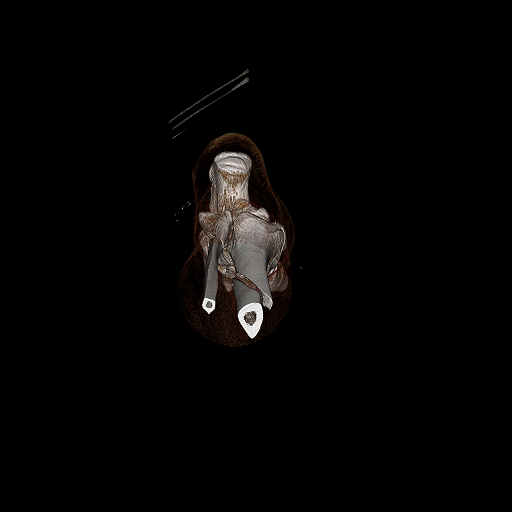

[1 of 3 positions shown; findings below may reference images not displayed]

FINDINGS: Please see dedicated CT right ankle report from 04/27/2018.
IMPRESSION: 3D reconstructions of right ankle fractures. Please see dedicated CT
right ankle report from 04/27/2018.

## 2019-07-03 IMAGING — DX PORTABLE RIGHT KNEE - 1-2 VIEW
2 series · 2 of 2 positions shown · non-contrast
Comparison: None.

CLINICAL DATA: Right Decarlo fracture ex fix right ankle

EXAM:
PORTABLE RIGHT KNEE - 1-2 VIEW

[knee ap]
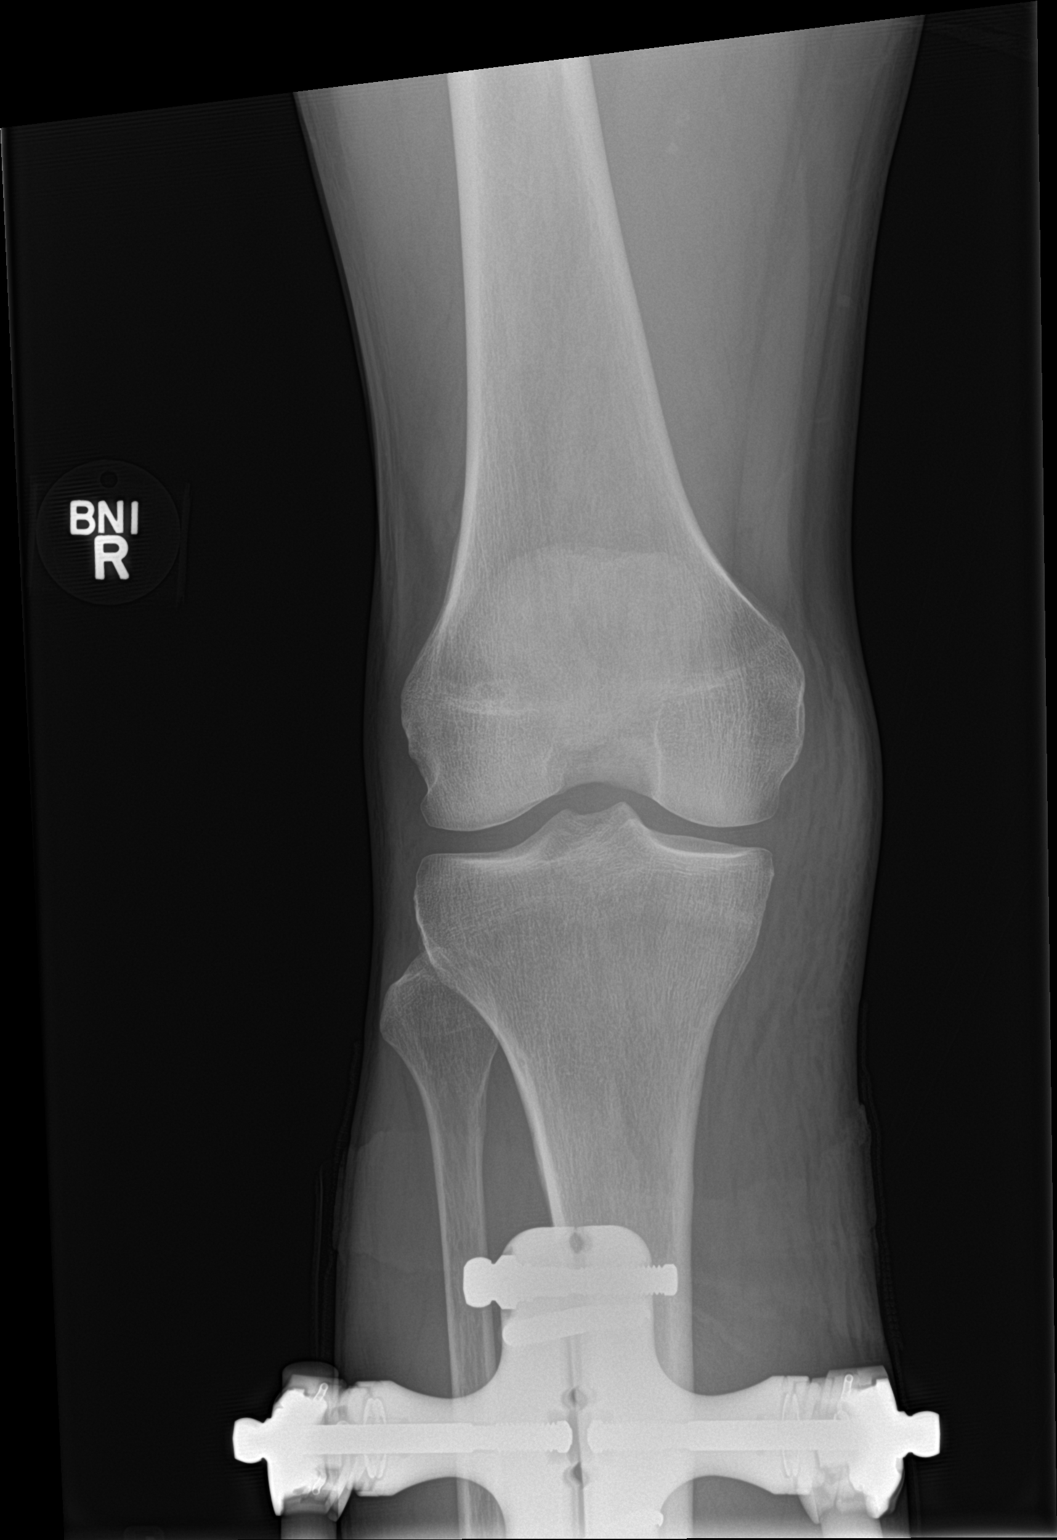

[knee lat]
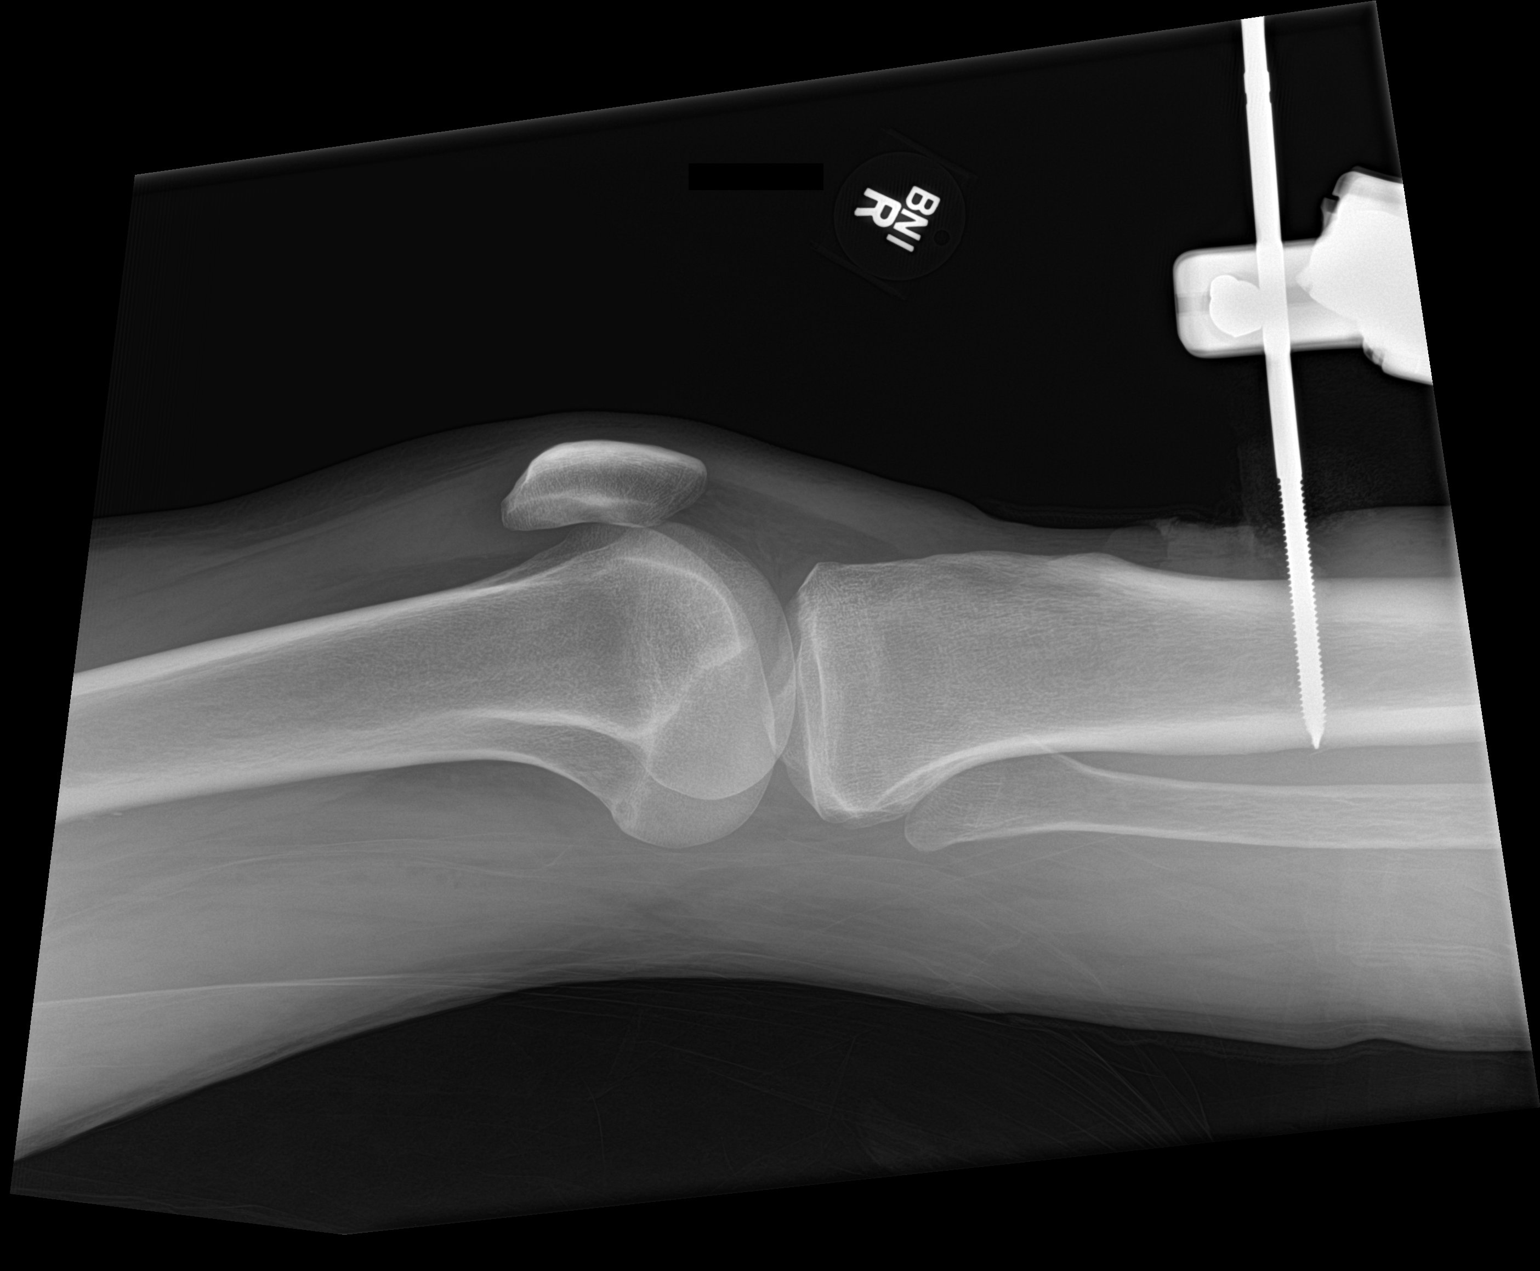

[2 of 2 positions shown; findings below may reference images not displayed]

FINDINGS: No evidence of fracture, dislocation, or joint effusion. No evidence
of arthropathy or other focal bone abnormality. Soft tissues are
unremarkable. Proximal portion of the ex fix device in satisfactory
position with a screw in the proximal tibial diaphysis.
IMPRESSION: Interval placement of ex fix device to transfix a right Decarlo
fracture.

## 2019-07-26 IMAGING — RF RIGHT TIBIA AND FIBULA - 2 VIEW
1 series · 7 of 7 positions shown · non-contrast
Comparison: 04/28/2018, 04/27/2018

CLINICAL DATA: 48-year-old male with open reduction internal
fixation

EXAM:
DG C-ARM 61-120 MIN; RIGHT TIBIA AND FIBULA - 2 VIEW

[Series 1: run · 7 of 7 slices shown]
[im 1/7]
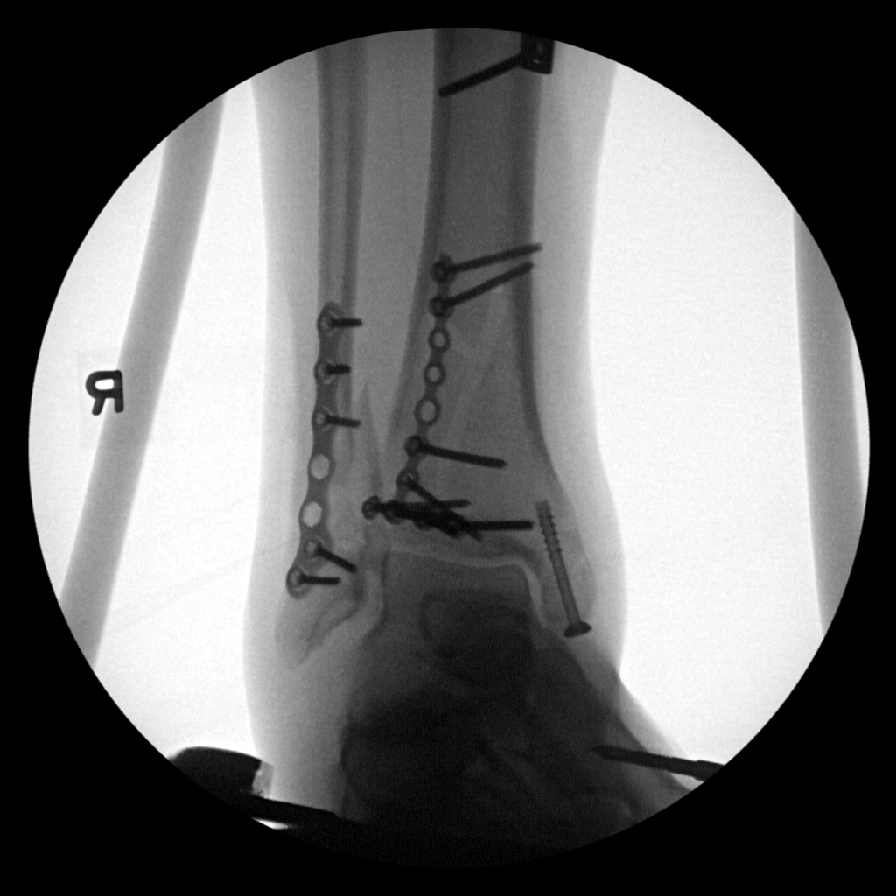
[im 2/7]
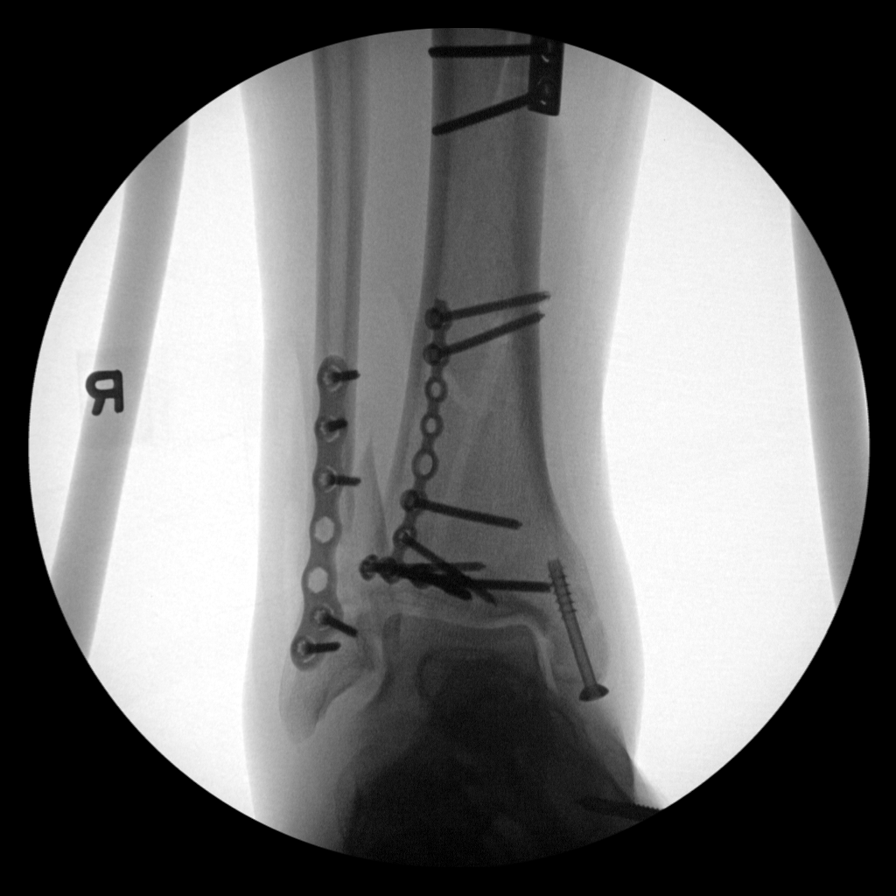
[im 3/7]
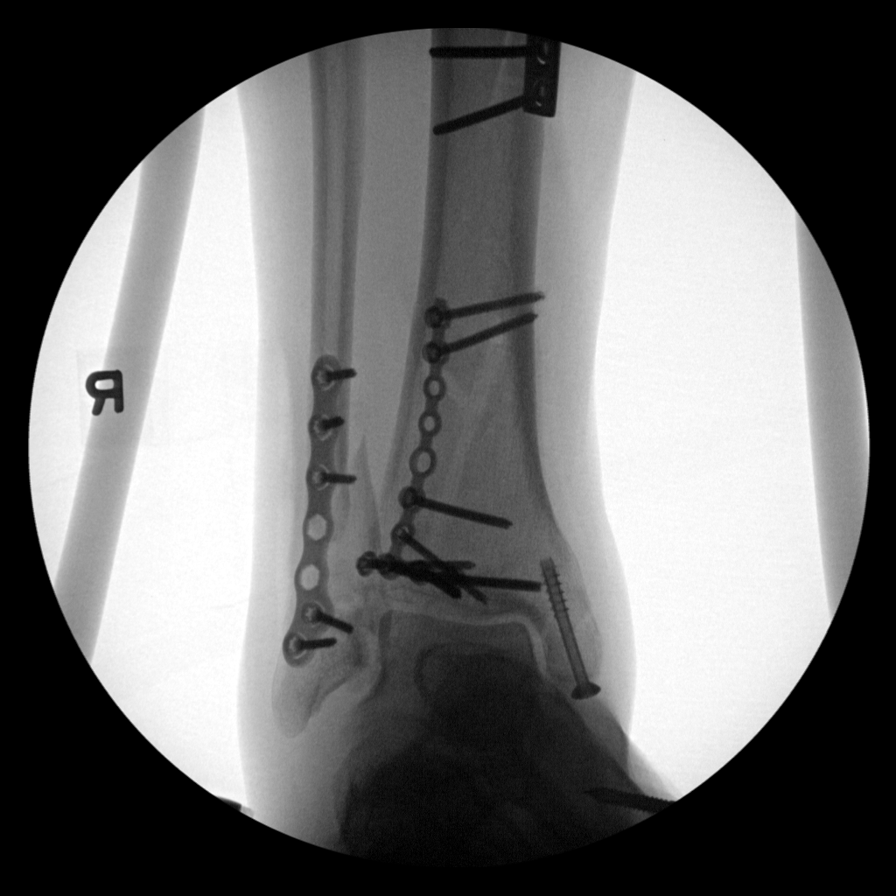
[im 4/7]
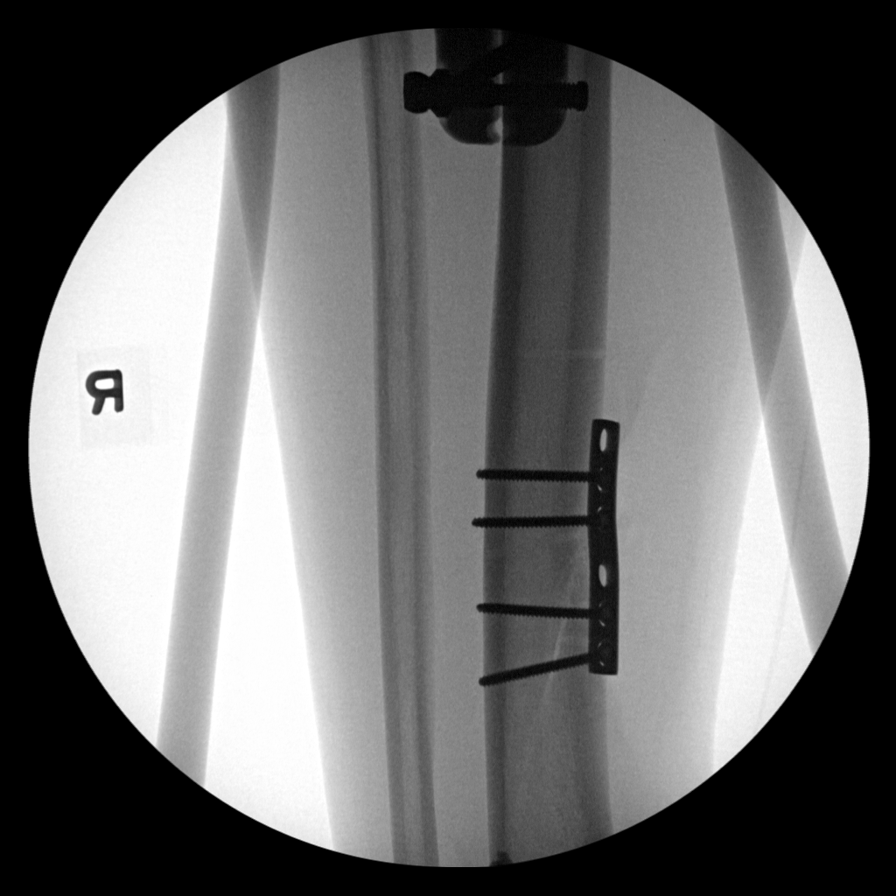
[im 5/7]
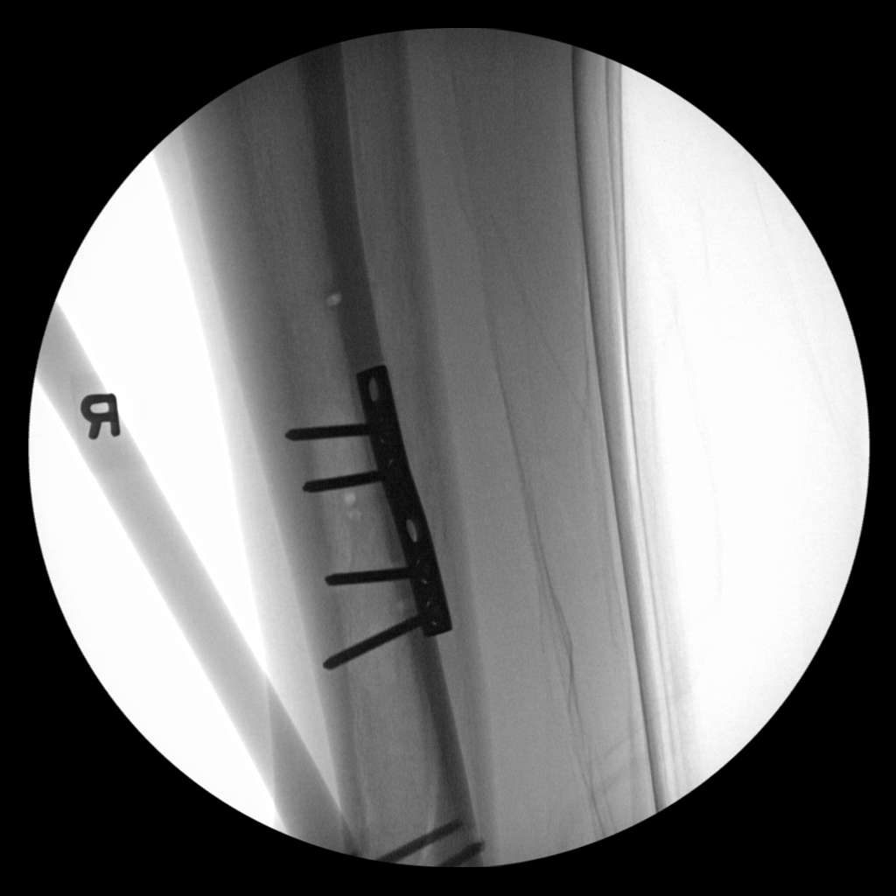
[im 6/7]
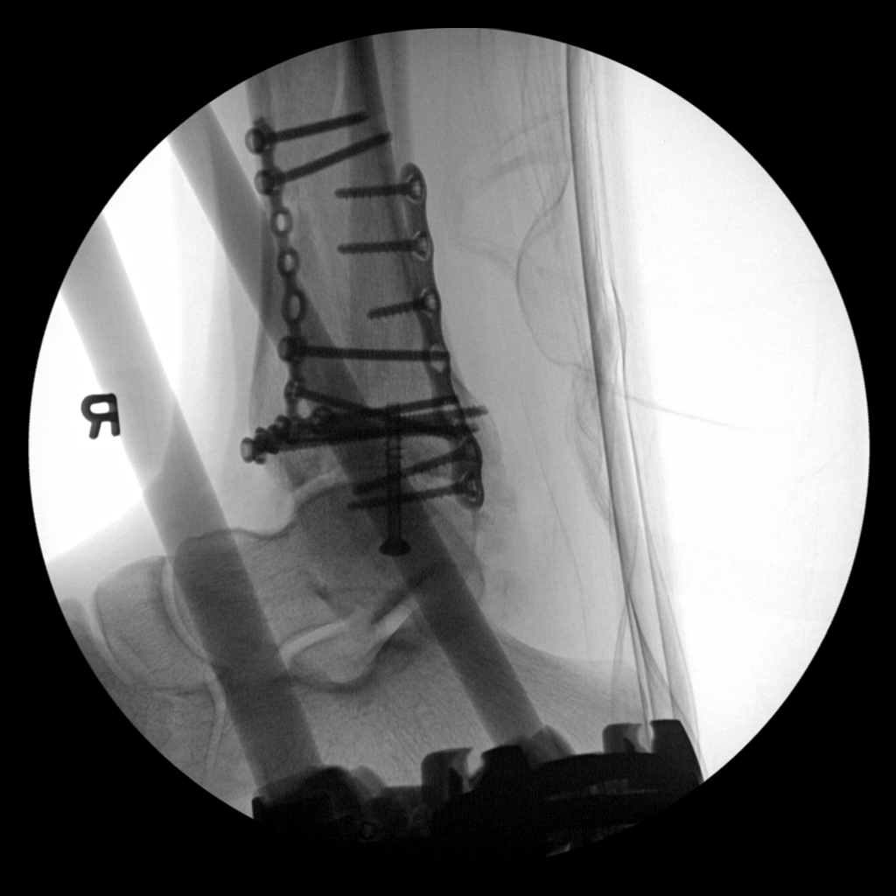
[im 7/7]
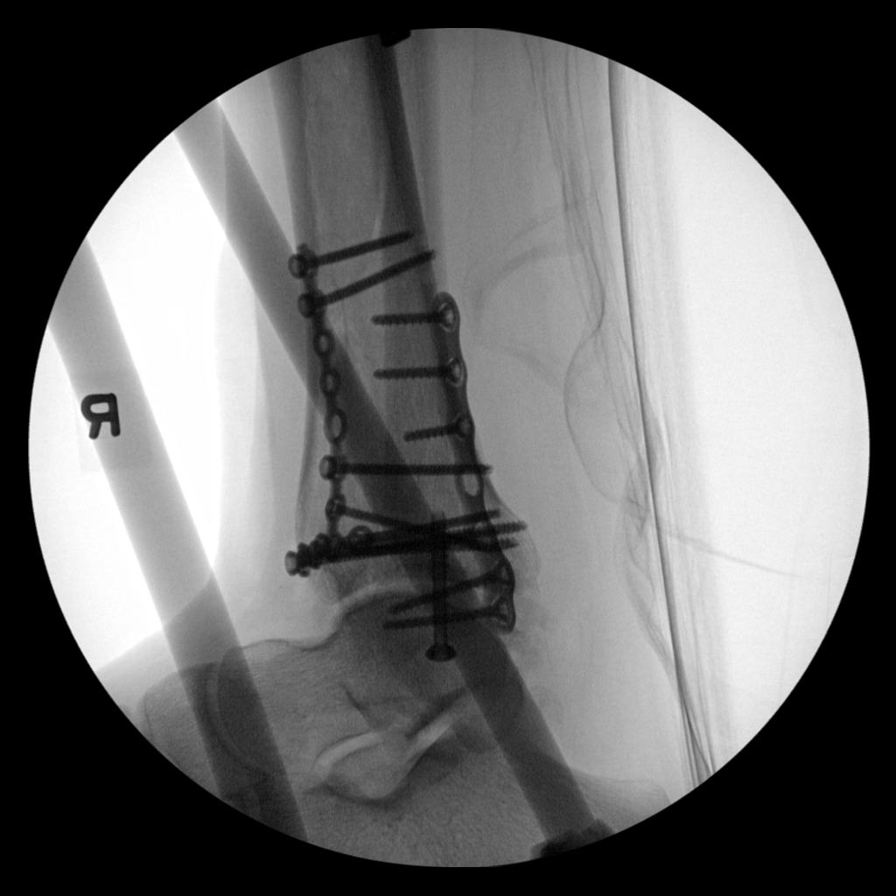

[7 of 7 positions shown; findings below may reference images not displayed]

FINDINGS: Limited intraoperative fluoroscopic spot images.

Surgical changes of plate and screw fixation of distal fibula and
distal tibia fracture. Single leg screw of the medial malleolus.
IMPRESSION: Limited intraoperative fluoroscopic spot images of open reduction
internal fixation of distal fibula and tibia fractures. Please refer
to the dictated operative report for full details of intraoperative
findings and procedure.

## 2020-08-17 ENCOUNTER — Telehealth: Payer: Self-pay | Admitting: Family Medicine

## 2020-08-17 NOTE — Telephone Encounter (Signed)
Provider currently not accepting new patients unless family member of current patient. Please refer to provider accepting new patients at this time.  

## 2020-08-17 NOTE — Telephone Encounter (Signed)
Patient and his 2 daughters Junius Roads & Charlesetta Garibaldi would love to start with [facility].  Dr Marikay Alar referred them to [place]!   Please advise I can call and get them set up
